# Patient Record
Sex: Male | Born: 1935 | Race: White | Hispanic: No | Marital: Married | Smoking: Former smoker
Health system: Southern US, Community
[De-identification: ages and names within clinical notes are randomized; demographics above are authoritative.]

## PROBLEM LIST (undated history)

## (undated) DIAGNOSIS — I10 Essential (primary) hypertension: Secondary | ICD-10-CM

## (undated) DIAGNOSIS — E119 Type 2 diabetes mellitus without complications: Secondary | ICD-10-CM

## (undated) DIAGNOSIS — E785 Hyperlipidemia, unspecified: Secondary | ICD-10-CM

## (undated) DIAGNOSIS — I219 Acute myocardial infarction, unspecified: Secondary | ICD-10-CM

## (undated) HISTORY — PX: OTHER SURGICAL HISTORY: SHX169

---

## 2002-08-29 ENCOUNTER — Emergency Department (HOSPITAL_COMMUNITY): Admission: EM | Admit: 2002-08-29 | Discharge: 2002-08-29 | Payer: Self-pay | Admitting: Emergency Medicine

## 2003-11-10 ENCOUNTER — Ambulatory Visit (HOSPITAL_COMMUNITY): Admission: RE | Admit: 2003-11-10 | Discharge: 2003-11-10 | Payer: Self-pay | Admitting: Internal Medicine

## 2004-11-14 ENCOUNTER — Emergency Department (HOSPITAL_COMMUNITY): Admission: EM | Admit: 2004-11-14 | Discharge: 2004-11-14 | Payer: Self-pay | Admitting: Emergency Medicine

## 2008-12-03 ENCOUNTER — Emergency Department (HOSPITAL_COMMUNITY): Admission: EM | Admit: 2008-12-03 | Discharge: 2008-12-03 | Payer: Self-pay | Admitting: Emergency Medicine

## 2009-04-07 ENCOUNTER — Ambulatory Visit (HOSPITAL_COMMUNITY): Admission: RE | Admit: 2009-04-07 | Discharge: 2009-04-08 | Payer: Self-pay | Admitting: Ophthalmology

## 2010-01-10 ENCOUNTER — Emergency Department (HOSPITAL_COMMUNITY): Admission: EM | Admit: 2010-01-10 | Discharge: 2010-01-10 | Payer: Self-pay | Admitting: Emergency Medicine

## 2010-01-13 IMAGING — CR DG NECK SOFT TISSUE
1 series · 1 of 1 positions shown · non-contrast
Comparison: None

CLINICAL DATA: Difficulty swallowing.

NECK SOFT TISSUES - 1+ VIEW

[view not recorded]
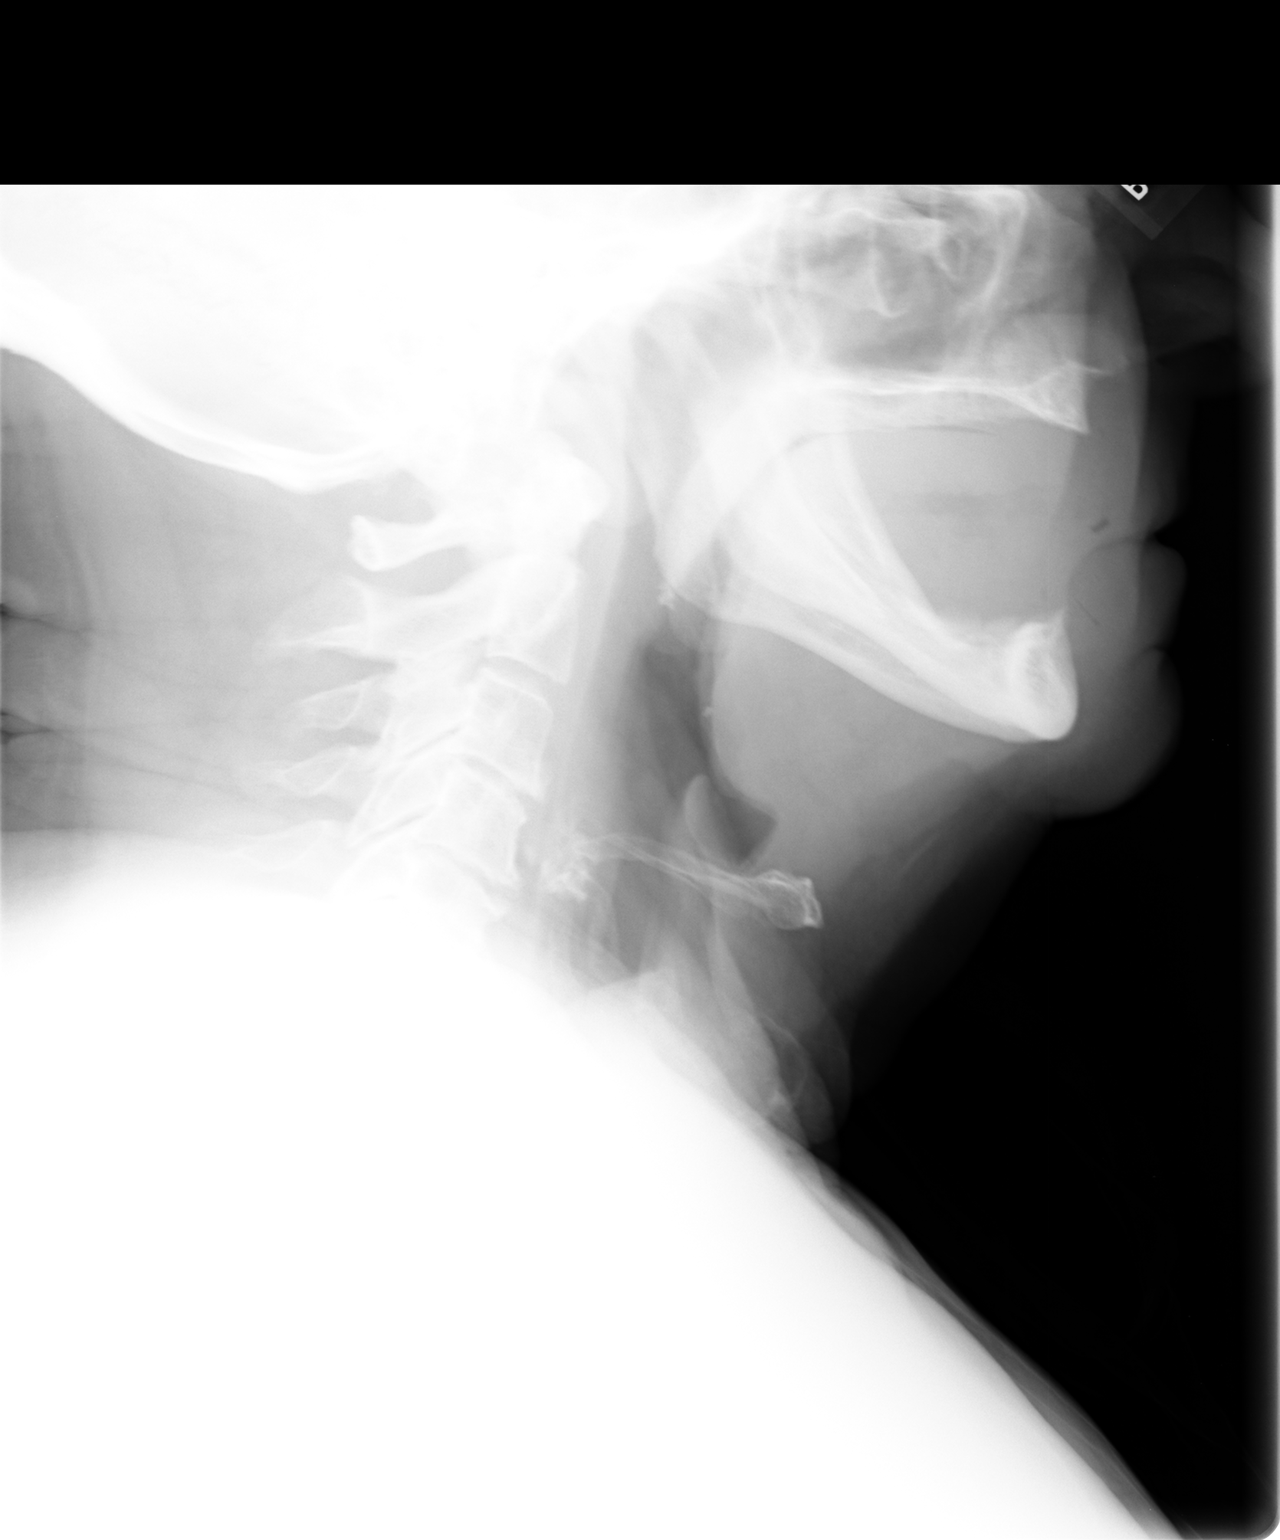

[1 of 1 positions shown; findings below may reference images not displayed]

FINDINGS: The patient is slightly rotated.  Epiglottic and
aryepiglottic fold shadows are sharp.  Prevertebral soft tissues
are within normal limits.  Degenerative changes are seen in the
spine.
IMPRESSION: 1.  No acute findings in the airway.
2.  Cervical spondylosis.

## 2011-02-20 IMAGING — CR DG CHEST 2V
2 series · 2 of 2 positions shown · non-contrast
Comparison: 12/03/2008

CLINICAL DATA: Cough, congestion, flu symptoms

CHEST - 2 VIEW

[view not recorded (1 of 2)]
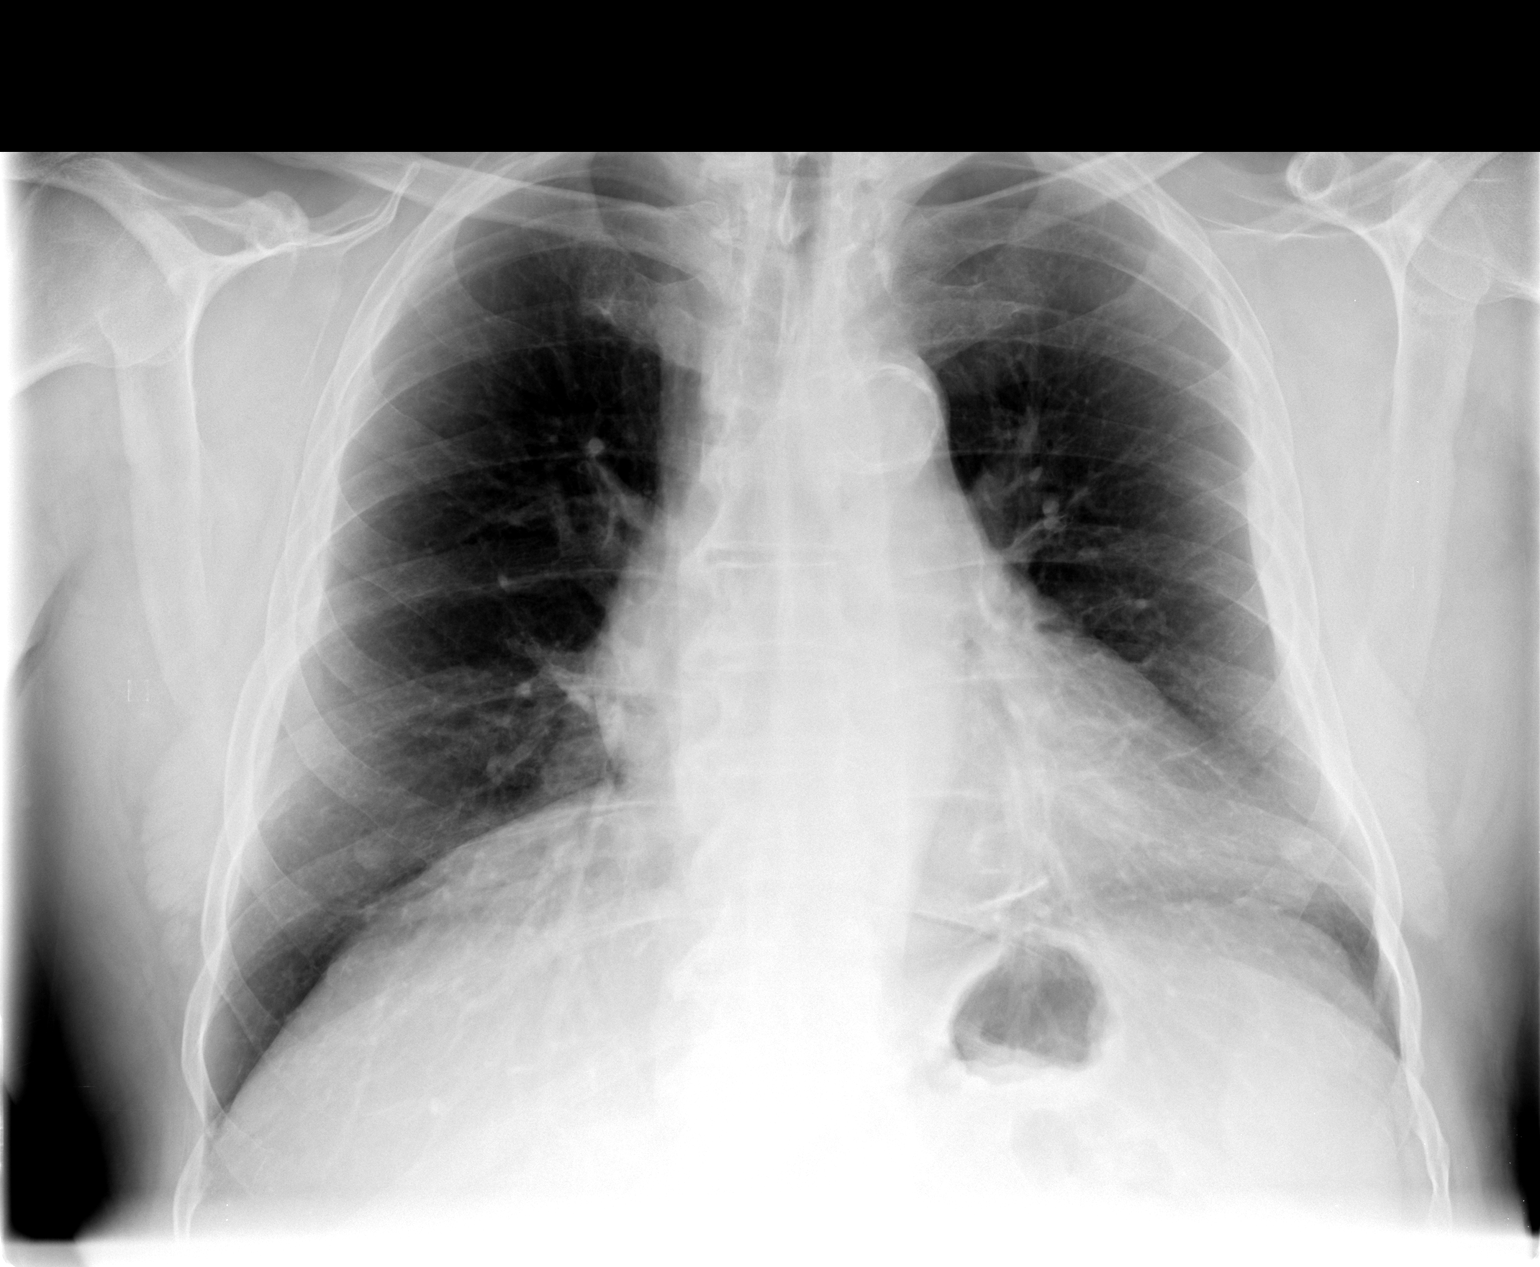

[view not recorded (2 of 2)]
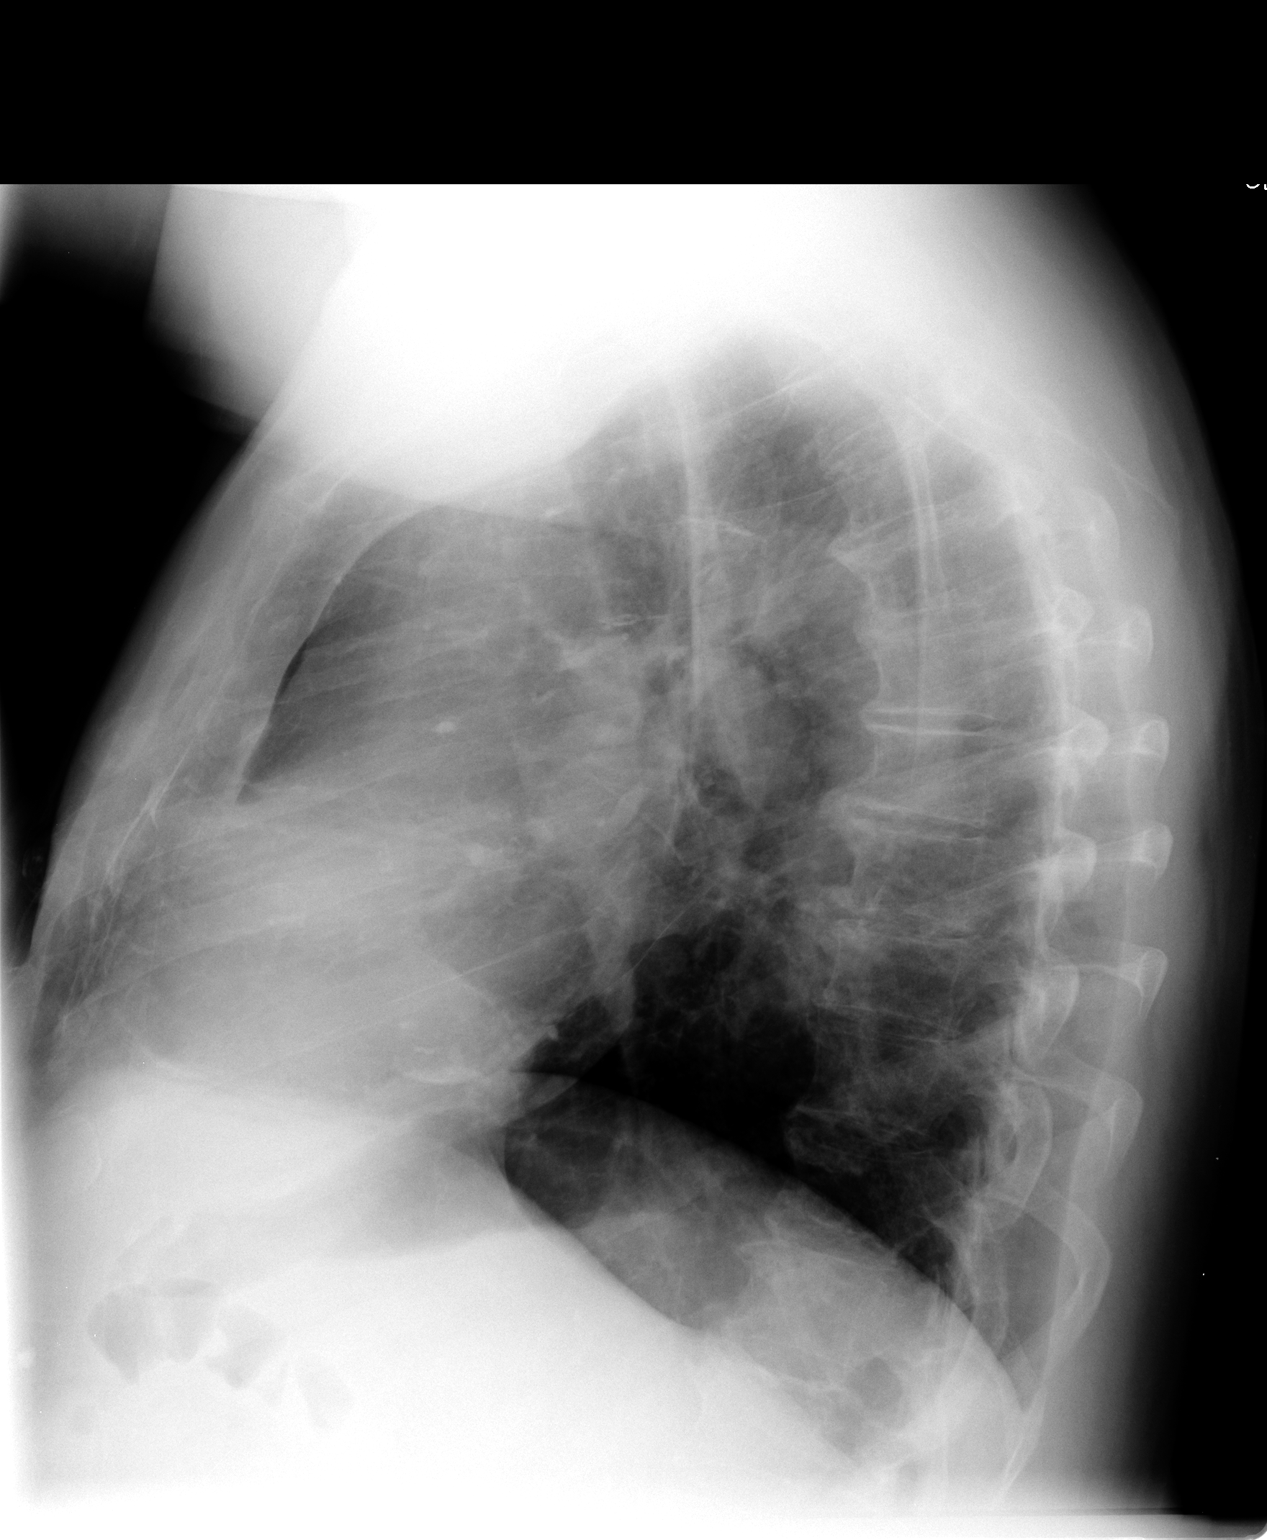

[2 of 2 positions shown; findings below may reference images not displayed]

FINDINGS: Stable mild cardiac enlargement.  Negative for CHF,
effusion or pneumothorax.  Minimal basilar atelectasis versus
scarring.  Degenerative thoracic spine.  Nipple shadows noted
bilaterally.  Atherosclerosis of the aorta.
IMPRESSION: Stable chronic changes.  No acute process.

## 2011-03-30 LAB — CBC
HCT: 42.7 % (ref 39.0–52.0)
Hemoglobin: 14.9 g/dL (ref 13.0–17.0)
MCV: 84.1 fL (ref 78.0–100.0)
RBC: 5.08 MIL/uL (ref 4.22–5.81)
WBC: 10.1 10*3/uL (ref 4.0–10.5)

## 2011-03-30 LAB — BASIC METABOLIC PANEL
CO2: 26 mEq/L (ref 19–32)
GFR calc non Af Amer: 56 mL/min — ABNORMAL LOW (ref >60)
Glucose, Bld: 212 mg/dL — ABNORMAL HIGH (ref 70–99)
Potassium: 3.9 mEq/L (ref 3.5–5.1)
Sodium: 140 mEq/L (ref 135–145)

## 2011-05-03 NOTE — Op Note (Signed)
NAMESONG, GARRIS                 ACCOUNT NO.:  [id number]   MEDICAL RECORD NO.:  [id number]          PATIENT TYPE:  OIB   LOCATION:  5151                         FACILITY:  [facility]   PHYSICIAN:  John D. Ashley Royalty, M.D. 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   PROCEDURES:  1. Pars plana vitrectomy, left eye.  2. Retinal photocoagulation, left eye.  3. Placement of secondary intraocular lens with suture and gas-fluid      exchange, left eye.   SURGEON:  Beulah Gandy. Ashley Royalty, MD   ASSISTANT:  Rosalie Doctor, MA   ANESTHESIA:  General.   DETAILS OF THE PROCEDURE:  Usual prep and drape, conjunctival peritomy  from 8 o'clock to 4 o'clock.  Two half-thickness scleral flaps were  raised at 3 o'clock and 9 o'clock in anticipation of IOL suture.  A 3  layer corneoscleral wound was opened up between 11 o'clock and 2  o'clock.  The 25-gauge trocars were placed at 10 o'clock, 2 o'clock, and  4 o'clock with infusion at 4 o'clock.  Pars plana vitrectomy was begun  just behind the pupillary axis.  Provisc was placed on the corneal  surface and the BIOM viewing system was used.  There was areas of dense  corneal edema.  The vitrectomy was carried posteriorly down to the  macular surface where blood and vitreous was encountered.  This was  carefully removed.  Then the vitrectomy was carried out into the far  periphery down to the vitreous base where it was trimmed for 360  degrees.  All blood was removed.  The endolaser was positioned in the  eye, power was 2000 milliwatts, 1000 microns each, 624 burns, 0.1  seconds each.  These burns were placed in 2 rows in the retinal  periphery.  The corneal wound was opened.  Two Prolene sutures were  placed beneath the scleral flaps from 3 o'clock to 9 o'clock behind the  iris and  anterior to capsular remnants in the ciliary sulcus.  These  sutures were drawn externally through the corneoscleral wound.  An  intraocular lens was brought onto the field made by [employer] model CC70BD, power 22.5 D, length 12.5 mm, optic 7.0 mm, serial  #[id number], expiration date April 2013.  The lens was brought onto  the field, cleaned, and inspected.  The Prolene sutures were attached to  the eyelets of the lens.  The lens was passed through the corneoscleral  wound into the anterior chamber then into the posterior chamber.  It was  dialed into place in the ciliary sulcus.  The Prolene sutures were  knotted beneath the scleral flaps and the scleral flaps were allowed to  cover the knots.  The corneal wound was closed with 4 interrupted 10-0  nylon sutures.  Pars plana vitrectomy was once again performed, removing  granules of pigment.  A 30% gas-fluid exchange was performed.  The  instruments were removed from the eye and the 25-gauge trocars were  removed.  The conjunctiva was reposited with 7-0 chromic suture.  Polymyxin and gentamicin were irrigated into [place] space.  Marcaine was  injected around the globe for postop pain.  Decadron 10 mg was injected  into the lower subconjunctival space.  Polysporin ophthalmic ointment, a  patch, and shield were placed.  Closing pressure was 10 with a [place]  tonometer.   COMPLICATIONS:  None.   DURATION:  1 hour 15 minutes.      Beulah Gandy. Ashley Royalty, M.D.  Electronically Signed     JDM/MEDQ  D:  04/07/2009  T:  04/08/2009  Job:  [id number]

## 2011-05-06 NOTE — H&P (Signed)
NAME:  Andrew Madden, Andrew Madden                             ACCOUNT NO.:  [id number]   MEDICAL RECORD NO.:  [id number]                  PATIENT TYPE:   LOCATION:                                       FACILITY:   PHYSICIAN:  Lionel December, M.D.                 DATE OF BIRTH:  1929/10/02   DATE OF ADMISSION:  DATE OF DISCHARGE:                                HISTORY & PHYSICAL   CHIEF COMPLAINT:  Chronic diarrhea.   HISTORY OF PRESENT ILLNESS:  [name] is a 77 year old Caucasian male  who presents to our office with a two-month history of persistent, loose,  watery diarrhea with approximately seven to eight stools per day.  He states  that the stools are nonbloody and nonmucous.  He also claims they are a dark  yellowish brown.  He denies any associated abdominal pain or cramping.  He  does report decreased appetite secondary to fear of going to the restroom.  He reports a 23-pound weight loss in the past two months.  He denies any  recent travel, new pets, or recent antibiotics.  He denies any exposure to  recent gastroenteritis.  He has never had colonoscopy before.  He denies any  history of IBS or peptic ulcer disease.  He denies any family history of  colorectal carcinoma, liver, or chronic GI problems.   PAST MEDICAL HISTORY:  1. Hypertension.  2. Hypercholesterolemia.  3. CAD and MI in 1994.   PAST SURGICAL HISTORY:  Denies any.   CURRENT MEDICATIONS:  1. Univasc 15 mg one-half tablet daily.  2. Hydrochlorothiazide 12.5 mg daily.  3. Lipitor 20 mg daily.  4. Metoprolol 25 mg daily.  5. Closiramine - discontinued on October 21, 2003.  6. Aspirin 325 mg daily.   ALLERGIES:  No known drug allergies.   FAMILY HISTORY:  No known family history of colorectal carcinoma, liver, or  chronic GI problems.  Mother is deceased at age 4 due to diabetes mellitus  and CAD.  Father is deceased at 15 secondary to dementia.  He has four  sisters as well as four brothers alive with  multiple health problems, and  two brothers deceased secondary to carcinoma of unknown etiology.   SOCIAL HISTORY:  Andrew Madden has been married for 43 years.  He has one grown  son who is in good health.  He is a retired [employer] educational needs teacher.  He  denies any tobacco or alcohol or drug use.   REVIEW OF SYSTEMS:  CONSTITUTIONAL:  See HPI.  Significant weight loss.  Denies any fever or chills.  SKIN:  He denies any rash or jaundice.  GI:  See HPI.  He denies any dyspepsia, GERD, or melena.  ENDOCRINE:  He denies  any thyroid problems or diabetes mellitus.   PHYSICAL EXAMINATION:  VITAL SIGNS:  Weight 187 pounds, height 67 inches.  Temperature 98.2, blood pressure 140/96,  pulse 80.  GENERAL:  Andrew Madden is a 77 year old Caucasian male who presents to  our office and appears his stated age.  He is nontoxic, well-developed, well-  nourished, and in no acute distress.  HEENT:  Sclerae clear, conjunctivae pink.  Oropharynx pink and moist without  any lesions.  NECK:  Supple without mass or thyromegaly.  CHEST:  Heart regular rate and rhythm without murmurs, clicks, rubs, or  gallops.  Lungs clear to auscultation bilaterally.  ABDOMEN:  Positive bowel sounds x4.  No bruits auscultated.  Soft,  nontender, nondistended.  No palpable organomegaly.  EXTREMITIES:  Show 2+ pedal pulses bilaterally, no pedal edema.  SKIN:  Pink, warm, and dry without any rash or jaundice.  RECTAL:  There are multiple small hemorrhoids to the exterior sphincter.  They are nonthrombosed or erythematous.  Internal exam reveals small amount  of thin, yellowish-brown heme negative stool.   ASSESSMENT:  [name] is a 77 year old Caucasian male with persistent  chronic diarrhea.  Would like to obtain stool studies today to rule out any  infectious process.  I will also check thyroid as well as MET-7 to rule out  hyperthyroidism which is unlikely, or any metabolic disturbances.  Recommended colonoscopy as  he has not yet has screening colonoscopy, as well  as possibly obtaining biopsy for evaluation of chronic diarrhea.  This will  be performed by Dr. Karilyn Cota at [facility].  I have discussed  colonoscopy with Andrew Madden as well as his wife.  I discussed risks and  benefits which include but are not limited to bleeding, perforation, and  infection.  He agrees with this plan.  He was instructed to hold his aspirin  for three days prior to the procedure.   RECOMMENDATIONS:  1. Colonoscopy with Dr. Karilyn Cota at [facility].  2. Encouraged increased p.o. fluids and diet as tolerated.  3.Can take Imodium as needed.  1. Stool studies for C&S, O&P, C. difficile, and wbc's.  2. Laboratory studies to include TSH, CBC, and MET-7.  3. Further recommendations to follow pending colonoscopy by Dr. Karilyn Cota.   We would like to thank Dr. Juanetta Gosling for this kind referral.     _____________________________________  ___________________________________________  Nicholas Lose, N.P.               Lionel December, M.D.   KC/MEDQ  D:  10/22/2003  T:  10/22/2003  Job:  [id number]   cc:   Ramon Dredge L. Juanetta Gosling, M.D.  [address]  [place]  [place] [address]  Fax: [phone]

## 2011-05-06 NOTE — Op Note (Signed)
NAME:  Andrew Madden, Andrew Madden                           ACCOUNT NO.:  [id number]   MEDICAL RECORD NO.:  [id number]                   PATIENT TYPE:  AMB   LOCATION:  DAY                                  FACILITY:  [facility]   PHYSICIAN:  Lionel December, M.D.                 DATE OF BIRTH:  1929/05/26   DATE OF PROCEDURE:  11/10/2003  DATE OF DISCHARGE:                                 OPERATIVE REPORT   PROCEDURE:  Total colonoscopy.   ENDOSCOPIST:  Lionel December, M.D.   INDICATIONS:  [name] is a 77 year old Caucasian male who has had a more  than 55-month history of nonbloody diarrhea.  His stool study has been  negative and he has lost 23 pounds.  He is undergoing diagnostic  colonoscopy.  The procedure and risks were reviewed with the patient and  informed consent was obtained.   PREOPERATIVE MEDICATIONS:  Demerol 25 mg IV and Versed 4 mg IV in divided  dose.   FINDINGS:  Procedure performed in endoscopy suite.  The patient's vital  signs and O2 saturation were monitored during the procedure and remained  stable.  The patient was placed in the left lateral recumbent position and  rectal examination was performed.  He has soft central skin tags.  Digital  exam was normal.   Olympus videoscope was placed in the rectum and advanced under vision into  the sigmoid colon and beyond.  Preparation was excellent.  He had a few  small diverticula at the sigmoid colon.  There were patchy erythema erosions  involving most of the transverse colon as well as ascending colon.  Cecal  mucosa revealed some friability.  The cecal landmarks were identified and  photographed for the record.  Ileocecal valve was normal.  As the scope was  withdrawn the colonic mucosa was, once again, carefully examined.  A biopsy  was taken from this inflamed mucosa at the transverse colon.  There were 2  polyps that were snared.  The larger one was at the [facility] colon 7-8 mm.  Another one was at the rectosigmoid junction  at about 4 mm.  Both of these  were retrieved for histologic examination.  The rectal mucosa was normal.   The scope was retroflexed to examine the anorectal junction and moderate-  sized hemorrhoids were noted below the dentate line.  The endoscope was  straightened and withdrawn.  The patient tolerated the procedure well.   FINAL DIAGNOSES:  1. Patchy colitis involving the proximal half of the colon.  Endoscopic     appearance suspicious for resolving antibiotic induced colitis.  2. A few diverticula at the sigmoid colon.  3. Two polyps snared, one from the sigmoid colon and another one from the     rectosigmoid junction.  4. Moderate sized external hemorrhoids.   RECOMMENDATIONS:  1. Standard instructions given.  2. Metronidazole 250 mg p.o. t.i.d. for 1  week.  3. I will be contacting the patient with the biopsy results and further     recommendations.      ___________________________________________                                            Lionel December, M.D.   NR/MEDQ  D:  11/10/2003  T:  11/10/2003  Job:  [id number]   cc:   Ramon Dredge L. Juanetta Gosling, M.D.  [address]  [place]  [place] [address]  Fax: [phone]

## 2013-02-14 ENCOUNTER — Other Ambulatory Visit (HOSPITAL_COMMUNITY): Payer: Self-pay | Admitting: Pulmonary Disease

## 2013-02-14 DIAGNOSIS — R221 Localized swelling, mass and lump, neck: Secondary | ICD-10-CM

## 2013-02-19 ENCOUNTER — Ambulatory Visit (HOSPITAL_COMMUNITY): Payer: Medicare Other

## 2015-02-26 DIAGNOSIS — H9193 Unspecified hearing loss, bilateral: Secondary | ICD-10-CM | POA: Diagnosis not present

## 2015-02-26 DIAGNOSIS — I2510 Atherosclerotic heart disease of native coronary artery without angina pectoris: Secondary | ICD-10-CM | POA: Diagnosis not present

## 2015-02-26 DIAGNOSIS — I10 Essential (primary) hypertension: Secondary | ICD-10-CM | POA: Diagnosis not present

## 2015-02-26 DIAGNOSIS — E119 Type 2 diabetes mellitus without complications: Secondary | ICD-10-CM | POA: Diagnosis not present

## 2015-08-31 DIAGNOSIS — I2510 Atherosclerotic heart disease of native coronary artery without angina pectoris: Secondary | ICD-10-CM | POA: Diagnosis not present

## 2015-08-31 DIAGNOSIS — E119 Type 2 diabetes mellitus without complications: Secondary | ICD-10-CM | POA: Diagnosis not present

## 2015-08-31 DIAGNOSIS — I10 Essential (primary) hypertension: Secondary | ICD-10-CM | POA: Diagnosis not present

## 2015-09-15 DIAGNOSIS — I10 Essential (primary) hypertension: Secondary | ICD-10-CM | POA: Diagnosis not present

## 2015-09-15 DIAGNOSIS — I2510 Atherosclerotic heart disease of native coronary artery without angina pectoris: Secondary | ICD-10-CM | POA: Diagnosis not present

## 2015-09-15 DIAGNOSIS — E119 Type 2 diabetes mellitus without complications: Secondary | ICD-10-CM | POA: Diagnosis not present

## 2016-02-29 DIAGNOSIS — E119 Type 2 diabetes mellitus without complications: Secondary | ICD-10-CM | POA: Diagnosis not present

## 2016-02-29 DIAGNOSIS — I10 Essential (primary) hypertension: Secondary | ICD-10-CM | POA: Diagnosis not present

## 2016-02-29 DIAGNOSIS — I2510 Atherosclerotic heart disease of native coronary artery without angina pectoris: Secondary | ICD-10-CM | POA: Diagnosis not present

## 2016-02-29 DIAGNOSIS — E785 Hyperlipidemia, unspecified: Secondary | ICD-10-CM | POA: Diagnosis not present

## 2016-02-29 DIAGNOSIS — Z23 Encounter for immunization: Secondary | ICD-10-CM | POA: Diagnosis not present

## 2016-08-31 DIAGNOSIS — I129 Hypertensive chronic kidney disease with stage 1 through stage 4 chronic kidney disease, or unspecified chronic kidney disease: Secondary | ICD-10-CM | POA: Diagnosis not present

## 2016-08-31 DIAGNOSIS — E119 Type 2 diabetes mellitus without complications: Secondary | ICD-10-CM | POA: Diagnosis not present

## 2016-08-31 DIAGNOSIS — I2510 Atherosclerotic heart disease of native coronary artery without angina pectoris: Secondary | ICD-10-CM | POA: Diagnosis not present

## 2016-08-31 DIAGNOSIS — D696 Thrombocytopenia, unspecified: Secondary | ICD-10-CM | POA: Diagnosis not present

## 2016-08-31 DIAGNOSIS — Z23 Encounter for immunization: Secondary | ICD-10-CM | POA: Diagnosis not present

## 2017-04-04 DIAGNOSIS — I2510 Atherosclerotic heart disease of native coronary artery without angina pectoris: Secondary | ICD-10-CM | POA: Diagnosis not present

## 2017-04-04 DIAGNOSIS — E119 Type 2 diabetes mellitus without complications: Secondary | ICD-10-CM | POA: Diagnosis not present

## 2017-04-04 DIAGNOSIS — I10 Essential (primary) hypertension: Secondary | ICD-10-CM | POA: Diagnosis not present

## 2017-04-04 DIAGNOSIS — K644 Residual hemorrhoidal skin tags: Secondary | ICD-10-CM | POA: Diagnosis not present

## 2017-05-07 DIAGNOSIS — M5489 Other dorsalgia: Secondary | ICD-10-CM | POA: Diagnosis not present

## 2017-05-07 DIAGNOSIS — K640 First degree hemorrhoids: Secondary | ICD-10-CM | POA: Diagnosis not present

## 2017-05-07 DIAGNOSIS — E119 Type 2 diabetes mellitus without complications: Secondary | ICD-10-CM | POA: Diagnosis not present

## 2017-05-07 DIAGNOSIS — R197 Diarrhea, unspecified: Secondary | ICD-10-CM | POA: Diagnosis not present

## 2017-05-07 DIAGNOSIS — R531 Weakness: Secondary | ICD-10-CM | POA: Diagnosis not present

## 2017-05-07 DIAGNOSIS — R9431 Abnormal electrocardiogram [ECG] [EKG]: Secondary | ICD-10-CM | POA: Diagnosis not present

## 2017-05-23 DIAGNOSIS — E119 Type 2 diabetes mellitus without complications: Secondary | ICD-10-CM | POA: Diagnosis not present

## 2017-05-23 DIAGNOSIS — K649 Unspecified hemorrhoids: Secondary | ICD-10-CM | POA: Diagnosis not present

## 2017-05-23 DIAGNOSIS — Z79899 Other long term (current) drug therapy: Secondary | ICD-10-CM | POA: Diagnosis not present

## 2017-05-23 DIAGNOSIS — Z7984 Long term (current) use of oral hypoglycemic drugs: Secondary | ICD-10-CM | POA: Diagnosis not present

## 2017-05-23 DIAGNOSIS — Z7982 Long term (current) use of aspirin: Secondary | ICD-10-CM | POA: Diagnosis not present

## 2017-06-04 ENCOUNTER — Inpatient Hospital Stay (HOSPITAL_COMMUNITY)
Admission: EM | Admit: 2017-06-04 | Discharge: 2017-06-09 | DRG: 330 | Disposition: A | Discharge status: Home Health | Payer: Medicare HMO | Attending: Pulmonary Disease | Admitting: Pulmonary Disease

## 2017-06-04 ENCOUNTER — Emergency Department (HOSPITAL_COMMUNITY): Payer: Medicare HMO

## 2017-06-04 ENCOUNTER — Encounter (HOSPITAL_COMMUNITY): Payer: Self-pay | Admitting: Adult Health

## 2017-06-04 DIAGNOSIS — C20 Malignant neoplasm of rectum: Principal | ICD-10-CM | POA: Diagnosis present

## 2017-06-04 DIAGNOSIS — Z87891 Personal history of nicotine dependence: Secondary | ICD-10-CM

## 2017-06-04 DIAGNOSIS — I252 Old myocardial infarction: Secondary | ICD-10-CM

## 2017-06-04 DIAGNOSIS — Z23 Encounter for immunization: Secondary | ICD-10-CM

## 2017-06-04 DIAGNOSIS — K5730 Diverticulosis of large intestine without perforation or abscess without bleeding: Secondary | ICD-10-CM | POA: Diagnosis not present

## 2017-06-04 DIAGNOSIS — C787 Secondary malignant neoplasm of liver and intrahepatic bile duct: Secondary | ICD-10-CM | POA: Diagnosis present

## 2017-06-04 DIAGNOSIS — Z66 Do not resuscitate: Secondary | ICD-10-CM | POA: Diagnosis present

## 2017-06-04 DIAGNOSIS — E785 Hyperlipidemia, unspecified: Secondary | ICD-10-CM | POA: Diagnosis present

## 2017-06-04 DIAGNOSIS — Z515 Encounter for palliative care: Secondary | ICD-10-CM

## 2017-06-04 DIAGNOSIS — Z7189 Other specified counseling: Secondary | ICD-10-CM

## 2017-06-04 DIAGNOSIS — N179 Acute kidney failure, unspecified: Secondary | ICD-10-CM | POA: Diagnosis present

## 2017-06-04 DIAGNOSIS — Z7982 Long term (current) use of aspirin: Secondary | ICD-10-CM

## 2017-06-04 DIAGNOSIS — Z6824 Body mass index (BMI) 24.0-24.9, adult: Secondary | ICD-10-CM

## 2017-06-04 DIAGNOSIS — E119 Type 2 diabetes mellitus without complications: Secondary | ICD-10-CM | POA: Diagnosis present

## 2017-06-04 DIAGNOSIS — E440 Moderate protein-calorie malnutrition: Secondary | ICD-10-CM | POA: Diagnosis present

## 2017-06-04 DIAGNOSIS — R933 Abnormal findings on diagnostic imaging of other parts of digestive tract: Secondary | ICD-10-CM | POA: Diagnosis not present

## 2017-06-04 DIAGNOSIS — K654 Sclerosing mesenteritis: Secondary | ICD-10-CM | POA: Diagnosis not present

## 2017-06-04 DIAGNOSIS — K6389 Other specified diseases of intestine: Secondary | ICD-10-CM | POA: Diagnosis not present

## 2017-06-04 DIAGNOSIS — I10 Essential (primary) hypertension: Secondary | ICD-10-CM | POA: Diagnosis present

## 2017-06-04 DIAGNOSIS — K6289 Other specified diseases of anus and rectum: Secondary | ICD-10-CM | POA: Diagnosis present

## 2017-06-04 DIAGNOSIS — R1900 Intra-abdominal and pelvic swelling, mass and lump, unspecified site: Secondary | ICD-10-CM | POA: Diagnosis not present

## 2017-06-04 DIAGNOSIS — N2889 Other specified disorders of kidney and ureter: Secondary | ICD-10-CM | POA: Diagnosis not present

## 2017-06-04 DIAGNOSIS — K624 Stenosis of anus and rectum: Secondary | ICD-10-CM | POA: Diagnosis present

## 2017-06-04 DIAGNOSIS — K629 Disease of anus and rectum, unspecified: Secondary | ICD-10-CM | POA: Diagnosis not present

## 2017-06-04 HISTORY — DX: Hyperlipidemia, unspecified: E78.5

## 2017-06-04 HISTORY — DX: Essential (primary) hypertension: I10

## 2017-06-04 HISTORY — DX: Type 2 diabetes mellitus without complications: E11.9

## 2017-06-04 HISTORY — DX: Acute myocardial infarction, unspecified: I21.9

## 2017-06-04 LAB — CBC WITH DIFFERENTIAL/PLATELET
Basophils Absolute: 0 10*3/uL (ref 0.0–0.1)
Basophils Relative: 0 %
Eosinophils Absolute: 0.1 10*3/uL (ref 0.0–0.7)
Eosinophils Relative: 1 %
HCT: 38.1 % — ABNORMAL LOW (ref 39.0–52.0)
Hemoglobin: 12.8 g/dL — ABNORMAL LOW (ref 13.0–17.0)
Lymphocytes Relative: 22 %
Lymphs Abs: 2.5 10*3/uL (ref 0.7–4.0)
MCH: 29.3 pg (ref 26.0–34.0)
MCHC: 33.6 g/dL (ref 30.0–36.0)
MCV: 87.2 fL (ref 78.0–100.0)
Monocytes Absolute: 0.8 10*3/uL (ref 0.1–1.0)
Monocytes Relative: 7 %
Neutro Abs: 7.7 10*3/uL (ref 1.7–7.7)
Neutrophils Relative %: 70 %
Platelets: 203 10*3/uL (ref 150–400)
RBC: 4.37 MIL/uL (ref 4.22–5.81)
RDW: 14.1 % (ref 11.5–15.5)
WBC: 11.1 10*3/uL — ABNORMAL HIGH (ref 4.0–10.5)

## 2017-06-04 LAB — BASIC METABOLIC PANEL
Anion gap: 14 (ref 5–15)
BUN: 29 mg/dL — ABNORMAL HIGH (ref 6–20)
CO2: 28 mmol/L (ref 22–32)
Calcium: 9.8 mg/dL (ref 8.9–10.3)
Chloride: 103 mmol/L (ref 101–111)
Creatinine, Ser: 1.59 mg/dL — ABNORMAL HIGH (ref 0.61–1.24)
GFR calc Af Amer: 44 mL/min — ABNORMAL LOW (ref >60)
GFR calc non Af Amer: 38 mL/min — ABNORMAL LOW (ref >60)
Glucose, Bld: 143 mg/dL — ABNORMAL HIGH (ref 65–99)
Potassium: 3.9 mmol/L (ref 3.5–5.1)
Sodium: 145 mmol/L (ref 135–145)

## 2017-06-04 LAB — CBG MONITORING, ED: Glucose-Capillary: 132 mg/dL — ABNORMAL HIGH (ref 65–99)

## 2017-06-04 LAB — URINALYSIS, ROUTINE W REFLEX MICROSCOPIC
Bilirubin Urine: NEGATIVE
Glucose, UA: NEGATIVE mg/dL
Hgb urine dipstick: NEGATIVE
Ketones, ur: 5 mg/dL — AB
Leukocytes, UA: NEGATIVE
Nitrite: NEGATIVE
Protein, ur: NEGATIVE mg/dL
Specific Gravity, Urine: 1.038 — ABNORMAL HIGH (ref 1.005–1.030)
pH: 6 (ref 5.0–8.0)

## 2017-06-04 LAB — GLUCOSE, CAPILLARY: Glucose-Capillary: 148 mg/dL — ABNORMAL HIGH (ref 65–99)

## 2017-06-04 MED ORDER — LIDOCAINE HCL 2 % EX GEL
1.0000 "application " | Freq: Once | CUTANEOUS | Status: AC
  Administered 2017-06-04: 1 via TOPICAL
  Filled 2017-06-04: qty 10

## 2017-06-04 MED ORDER — SIMVASTATIN 20 MG PO TABS
20.0000 mg | ORAL_TABLET | Freq: Every day | ORAL | Status: DC
  Administered 2017-06-05 – 2017-06-08 (×4): 20 mg via ORAL
  Filled 2017-06-04 (×4): qty 1

## 2017-06-04 MED ORDER — NEOMY-BACIT-POLYMYX-PRAMOXINE 1 % EX OINT: 1.0000 "application " | TOPICAL_OINTMENT | Freq: Two times a day (BID) | CUTANEOUS | Status: DC

## 2017-06-04 MED ORDER — ACETAMINOPHEN 325 MG PO TABS: 650.0000 mg | ORAL_TABLET | Freq: Four times a day (QID) | ORAL | Status: DC | PRN

## 2017-06-04 MED ORDER — PNEUMOCOCCAL VAC POLYVALENT 25 MCG/0.5ML IJ INJ
0.5000 mL | INJECTION | INTRAMUSCULAR | Status: AC
  Administered 2017-06-05: 0.5 mL via INTRAMUSCULAR
  Filled 2017-06-04: qty 0.5

## 2017-06-04 MED ORDER — ENSURE ENLIVE PO LIQD
237.0000 mL | Freq: Two times a day (BID) | ORAL | Status: DC
  Administered 2017-06-05: 237 mL via ORAL

## 2017-06-04 MED ORDER — IOPAMIDOL (ISOVUE-300) INJECTION 61%
75.0000 mL | Freq: Once | INTRAVENOUS | Status: AC | PRN
  Administered 2017-06-04: 75 mL via INTRAVENOUS

## 2017-06-04 MED ORDER — HYDROCORTISONE 2.5 % RE CREA
1.0000 "application " | TOPICAL_CREAM | Freq: Two times a day (BID) | RECTAL | Status: DC
  Administered 2017-06-05: 1 via TOPICAL
  Filled 2017-06-04 (×2): qty 28.35

## 2017-06-04 MED ORDER — HYDROCODONE-ACETAMINOPHEN 5-325 MG PO TABS
1.0000 | ORAL_TABLET | ORAL | Status: DC | PRN
  Administered 2017-06-05 – 2017-06-07 (×4): 2 via ORAL
  Administered 2017-06-08 (×2): 1 via ORAL
  Filled 2017-06-04: qty 2
  Filled 2017-06-04 (×2): qty 1
  Filled 2017-06-04 (×3): qty 2

## 2017-06-04 MED ORDER — INSULIN ASPART 100 UNIT/ML ~~LOC~~ SOLN
0.0000 [IU] | Freq: Three times a day (TID) | SUBCUTANEOUS | Status: DC
  Administered 2017-06-07: 1 [IU] via SUBCUTANEOUS
  Administered 2017-06-07: 2 [IU] via SUBCUTANEOUS
  Administered 2017-06-08: 1 [IU] via SUBCUTANEOUS

## 2017-06-04 MED ORDER — ONDANSETRON HCL 4 MG PO TABS: 4.0000 mg | ORAL_TABLET | Freq: Four times a day (QID) | ORAL | Status: DC | PRN

## 2017-06-04 MED ORDER — SODIUM CHLORIDE 0.9 % IV BOLUS (SEPSIS)
1000.0000 mL | Freq: Once | INTRAVENOUS | Status: AC
  Administered 2017-06-04: 1000 mL via INTRAVENOUS

## 2017-06-04 MED ORDER — METOPROLOL TARTRATE 25 MG PO TABS
25.0000 mg | ORAL_TABLET | Freq: Two times a day (BID) | ORAL | Status: DC
  Administered 2017-06-05 – 2017-06-09 (×8): 25 mg via ORAL
  Filled 2017-06-04 (×8): qty 1

## 2017-06-04 MED ORDER — ONDANSETRON HCL 4 MG/2ML IJ SOLN
4.0000 mg | Freq: Once | INTRAMUSCULAR | Status: AC
  Administered 2017-06-04: 4 mg via INTRAVENOUS
  Filled 2017-06-04: qty 2

## 2017-06-04 MED ORDER — ONDANSETRON HCL 4 MG/2ML IJ SOLN: 4.0000 mg | Freq: Four times a day (QID) | INTRAMUSCULAR | Status: DC | PRN

## 2017-06-04 MED ORDER — LORAZEPAM 2 MG/ML IJ SOLN
1.0000 mg | Freq: Once | INTRAMUSCULAR | Status: AC
  Administered 2017-06-04: 1 mg via INTRAVENOUS
  Filled 2017-06-04: qty 1

## 2017-06-04 MED ORDER — SODIUM CHLORIDE 0.9 % IV SOLN
INTRAVENOUS | Status: AC
  Administered 2017-06-04: 23:00:00 via INTRAVENOUS

## 2017-06-04 NOTE — ED Notes (Signed)
Report given to Shady Dale, RN 300

## 2017-06-04 NOTE — ED Notes (Signed)
EKG given to Dr. Kohut 

## 2017-06-04 NOTE — H&P (Signed)
History and Physical    Andrew Madden MPN:[id number] DOB: 09-14-1929 DOA: 06/04/2017  PCP: Sinda Du, MD  Patient coming from:  home  Chief Complaint:   Rectal pain  HPI: Andrew Madden is a 83 y.o. male with medical history significant of DM, HTN, HLD comes in with over 2 months of progressive worsening rectal pain.  Pt was hospitalized at [facility] recently and was told to follow up with GI as outpatient however there was no imaging done.  Pt went home and did not follow up with GI and the pain has persisted.  Some passing of gas but very hard to have a bowel movement.  They have been thinking this is all due to his hemorrhoids.  There has been some blood when he wipes.  No vomiting but getting nauseas.  No fevers.  Not much of an appetite.  He has tried taking some of his wifes oxycodone pills which has made him very sick with vomiting.  Pt today found to have rectal mass with likely metastatic disease, no complete obstruction.  GI called and will do scope in the am.    Review of Systems: As per HPI otherwise 10 point review of systems negative.   Past Medical History:  Diagnosis Date  . Diabetes mellitus without complication ([place])   . Hyperlipidemia   . Hypertension   . Myocardial infarct [facility])     History reviewed. No pertinent surgical history.   reports that he has quit smoking. He has never used smokeless tobacco. He reports that he does not drink alcohol. His drug history is not on file.  No Known Allergies  History reviewed. No pertinent family history. no premature CAD  Prior to Admission medications   Medication Sig Start Date End Date Taking? Authorizing Provider  acetaminophen (TYLENOL) 325 MG tablet Take 650 mg by mouth every 6 (six) hours as needed for mild pain, fever or headache.   Yes [provider]  aspirin 325 MG tablet Take 650 mg by mouth daily.    Yes [provider]  hydrocortisone 2.5 % cream Apply 1 application topically 2 (two)  times daily. 05/23/17  Yes [provider]  losartan-hydrochlorothiazide (HYZAAR) 100-12.5 MG tablet Take 1 tablet by mouth daily. 03/17/17  Yes [provider]  metFORMIN (GLUMETZA) 500 MG (MOD) 24 hr tablet Take 500 mg by mouth daily with breakfast.   Yes [provider]  metoprolol tartrate (LOPRESSOR) 25 MG tablet Take 25 mg by mouth 2 (two) times daily.   Yes [provider]  PROCTOZONE-HC 2.5 % rectal cream Apply 1 application topically 2 (two) times daily. 05/03/17  Yes [provider]  simvastatin (ZOCOR) 40 MG tablet Take 20 mg by mouth at bedtime.    Yes [provider]  TRIPLE ANTIBIOTIC PLUS MAX ST 1 % OINT Apply 1 application topically 2 (two) times daily. 03/20/17  Yes [provider]    Physical Exam: Vitals:   06/04/17 1514 06/04/17 1516 06/04/17 1846  BP: 117/80  131/68  Pulse: 94  77  Resp: 18  18  Temp: 98.1 F (36.7 C)    TempSrc: Oral    SpO2: 98%  94%  Weight:  70.3 kg (155 lb)     Constitutional: NAD, calm, comfortable Vitals:   06/04/17 1514 06/04/17 1516 06/04/17 1846  BP: 117/80  131/68  Pulse: 94  77  Resp: 18  18  Temp: 98.1 F (36.7 C)    TempSrc: Oral  SpO2: 98%  94%  Weight:  70.3 kg (155 lb)    Eyes: PERRL, lids and conjunctivae normal ENMT: Mucous membranes are moist. Posterior pharynx clear of any exudate or lesions.Normal dentition.  Neck: normal, supple, no masses, no thyromegaly Respiratory: clear to auscultation bilaterally, no wheezing, no crackles. Normal respiratory effort. No accessory muscle use.  Cardiovascular: Regular rate and rhythm, no murmurs / rubs / gallops. No extremity edema. 2+ pedal pulses. No carotid bruits.  Abdomen: no tenderness, no masses palpated. No hepatosplenomegaly. Bowel sounds positive.  Musculoskeletal: no clubbing / cyanosis. No joint deformity upper and lower extremities. Good ROM, no contractures. Normal muscle tone.  Skin: no rashes, lesions,  ulcers. No induration Neurologic: CN 2-12 grossly intact. Sensation intact, DTR normal. Strength 5/5 in all 4.  Psychiatric: Normal judgment and insight. Alert and oriented x 3. Normal mood.    Labs on Admission: I have personally reviewed following labs and imaging studies  CBC:  Recent Labs Lab 06/04/17 1644  WBC 11.1*  NEUTROABS 7.7  HGB 12.8*  HCT 38.1*  MCV 87.2  PLT 578   Basic Metabolic Panel:  Recent Labs Lab 06/04/17 1644  NA 145  K 3.9  CL 103  CO2 28  GLUCOSE 143*  BUN 29*  CREATININE 1.59*  CALCIUM 9.8   GFR: CrCl cannot be calculated (Unknown ideal weight.).  CBG:  Recent Labs Lab 06/04/17 1555  GLUCAP 132*   Urine analysis:    Component Value Date/Time   COLORURINE YELLOW 06/04/2017 2020   APPEARANCEUR CLEAR 06/04/2017 2020   LABSPEC 1.038 (H) 06/04/2017 2020   PHURINE 6.0 06/04/2017 2020   GLUCOSEU NEGATIVE 06/04/2017 2020   HGBUR NEGATIVE 06/04/2017 2020   BILIRUBINUR NEGATIVE 06/04/2017 2020   KETONESUR 5 (A) 06/04/2017 2020   PROTEINUR NEGATIVE 06/04/2017 2020   NITRITE NEGATIVE 06/04/2017 2020   LEUKOCYTESUR NEGATIVE 06/04/2017 2020    Radiological Exams on Admission: Ct Abdomen Pelvis W Contrast  Result Date: 06/04/2017 CLINICAL DATA:  83 year old male with rectal pain and weight loss. EXAM: CT ABDOMEN AND PELVIS WITH CONTRAST TECHNIQUE: Multidetector CT imaging of the abdomen and pelvis was performed using the standard protocol following bolus administration of intravenous contrast. CONTRAST:  43mL ISOVUE-300 IOPAMIDOL (ISOVUE-300) INJECTION 61% COMPARISON:  None. FINDINGS: Lower chest: There is an 8 mm right middle lobe pulmonary nodule. A 5 mm nodule is also seen in the right middle lobe. Small pulmonary nodules including a 4 mm left lower lobe as well as a 5 mm right lower lobe noted. Nonemergent CT of the chest is recommended for complete evaluation of the lungs. There is coronary vascular calcification primarily involving the  LAD, and RCA. There is an area of calcification involving the inferior wall of the left ventricle which may be related to prior infarct. There is no intra-abdominal free air or free fluid. Hepatobiliary: There multiple hepatic hypodense lesions. The largest lesion or confluence of adjacent lesions in the right lobe of the liver measure up to 7.2 x 7.2 cm and most consistent with metastatic disease. There is no intrahepatic biliary ductal dilatation. The gallbladder is mildly distended. No calcified gallstone. No pericholecystic fluid. Pancreas: Unremarkable. No pancreatic ductal dilatation or surrounding inflammatory changes. Spleen: Normal in size without focal abnormality. Adrenals/Urinary Tract: Mildly thickened left adrenal gland may represent hyperplasia versus thickening. No discrete nodule. The right kidney is atrophic. Multiple right renal hypodense lesions are noted measuring up to 2.7 cm. These lesions demonstrate fluid attenuation most consistent with cysts. Subcentimeter right  renal hypodense lesions are too small to characterize but also likely represent cysts. There is cortical irregularity of the left kidney, likely related to prior infarct. There is a 2 cm left renal upper pole cyst. No hydronephrosis. The visualized ureters appear unremarkable. The urinary bladder is mildly distended. There is trabecular appearance of the bladder wall consistent with chronic bladder outlet obstruction. There is extension of the bladder into the upper right inguinal canal. Stomach/Bowel: There is irregular and thickened appearance of the rectal wall (series 2, image 93 and coronal series 6, image 81). There is sigmoid diverticulosis with muscular hypertrophy. Mild perisigmoid stranding likely represent chronic inflammation and scarring and less likely active inflammation. Moderate amount of stool noted throughout the colon and within the rectosigmoid. There is no evidence of bowel obstruction. Normal appendix.  Vascular/Lymphatic: There is advanced aortoiliac atherosclerotic disease. The origins of the celiac axis, SMA, IMA appear patent. The SMV, splenic vein, and main portal vein are patent. The IVC appears unremarkable. No portal venous gas identified. There is no adenopathy. Reproductive: The prostate gland is enlarged and heterogeneous measuring up to 6 cm in transverse axial diameter. Other: There are small left and moderate size right inguinal hernia containing fat. Musculoskeletal: There is degenerative changes of the spine. No acute fracture. IMPRESSION: 1. Thickened and irregular rectal wall concerning for a rectal mass. Correlation with clinical exam and sigmoidoscopy or colonoscopy recommended. There may be associated mass effect and luminal narrowing of the rectum with moderate amount of residual stool burden throughout the colon. 2. Sigmoid diverticulosis with evidence of chronic inflammation. No definite active inflammatory changes. 3. No bowel obstruction.  Normal appendix. 4. Hepatic hypodense lesions most consistent with metastatic disease. 5. Multiple pulmonary nodules as described concerning for metastatic disease. Nonemergent CT of the chest is recommended for complete evaluation of the lungs. 6.  Aortic Atherosclerosis (ICD10-I70.0). 7. Coronary vascular calcification. 8. Right renal atrophy. Electronically Signed   By: Anner Crete M.D.   On: 06/04/2017 19:25    EKG: Independently reviewed. nsr nonspecific tw flattening Old chart reviewed Case discussed with dr Wilson Singer  Assessment/Plan 83 yo male with rectal pain and bleeding for over 2 months found to have new rectal mass with mets  Principal Problem:   Rectal mass- will keep npo after midnight.  Hold anticoagulants.  GI consulted for likely scope tomorrow to get biopsy.  Will also consult general surgery as he may need debulking of tumor.  Start on norco low dose and advance if needed and as he tolerates.    Active Problems:    Rectal pain- due to mass   Hypertension- stable, holding hctz/arb due to mild aki   Hyperlipidemia- stalbe   Diabetes mellitus without complication ([place])- hold metformin, start SSI   AKI (acute kidney injury) ([place])- unknown what baseline, cr up to 1.6.  Hold htn meds, gentle ivf overnight and repeat    Have discussed code status with him and his only son/child.  He wishes to be a DNR, no intubation or CPR ever in the future.  I have brought up the possibility of surgical option to relieve some of his obstruction/pain of the tumor, he wishes to explore all options available to help relieve his discomfort.  DVT prophylaxis:  scds Code Status:   DNR Family Communication: son  Disposition Plan:  Per day team Consults called:   GI and gen surgery Admission status:    observation   Danuta Huseman A MD [facility]  If 7PM-7AM, please contact  night-coverage [email or web address] Password [facility]  06/04/2017, 8:51 PM

## 2017-06-04 NOTE — ED Notes (Signed)
Pt attempted to provide urine sample but was not able to do so at this time. Instructed family and pt to let nursing staff know when able to provide sample.

## 2017-06-04 NOTE — ED Triage Notes (Addendum)
Per son, pt evaluated at [facility] one month ago, VSS and pt sent home - Son states they did not help him- has not followed with Dr Demetrios Isaacs reports that pt has weakness and terrible hemorrhoids   Dr Luan Pulling is PCP

## 2017-06-04 NOTE — ED Notes (Signed)
Pt attempted to provide urine sample again but states he does not normally urinate often.

## 2017-06-04 NOTE — ED Provider Notes (Signed)
[facility] DEPT Provider Note   CSN: [id number] Arrival date & time: 06/04/17  1458     History   Chief Complaint Chief Complaint  Patient presents with  . Fatigue    HPI Andrew Madden is a 83 y.o. male.  HPI   [phone] with numerous complaints. Essentially failure to thrive for weeks-to-months. Rectal and back pain. Anorexia. Losing weight. He says he just has no appetite. Occasionally some nausea. No vomiting. No fever or chills. No urinary complaints. Reports was evaluated at [facility] about a month ago and told had hemorrhoids. Advised to FU with GI but hasnt yet. Symptoms worsening. Rectal pain worsening. Has been using a "hemorrhoid cream" but unsure of what specifically which hasnt helped.   Past Medical History:  Diagnosis Date  . Diabetes mellitus without complication ([place])   . Hyperlipidemia   . Hypertension   . Myocardial infarct ([place])     There are no active problems to display for this patient.   History reviewed. No pertinent surgical history.     Home Medications    Prior to Admission medications   Medication Sig Start Date End Date Taking? Authorizing Provider  acetaminophen (TYLENOL) 325 MG tablet Take 650 mg by mouth every 6 (six) hours as needed for mild pain, fever or headache.   Yes [provider]  aspirin 325 MG tablet Take 650 mg by mouth daily.    Yes [provider]  hydrocortisone 2.5 % cream Apply 1 application topically 2 (two) times daily. 05/23/17  Yes [provider]  losartan-hydrochlorothiazide (HYZAAR) 100-12.5 MG tablet Take 1 tablet by mouth daily. 03/17/17  Yes [provider]  metFORMIN (GLUMETZA) 500 MG (MOD) 24 hr tablet Take 500 mg by mouth daily with breakfast.   Yes [provider]  metoprolol tartrate (LOPRESSOR) 25 MG tablet Take 25 mg by mouth 2 (two) times daily.   Yes [provider]  PROCTOZONE-HC 2.5 % rectal cream Apply 1 application topically 2 (two) times daily.  05/03/17  Yes [provider]  simvastatin (ZOCOR) 40 MG tablet Take 20 mg by mouth at bedtime.    Yes [provider]  TRIPLE ANTIBIOTIC PLUS MAX ST 1 % OINT Apply 1 application topically 2 (two) times daily. 03/20/17  Yes [provider]    Family History History reviewed. No pertinent family history.  Social History Social History  Substance Use Topics  . Smoking status: Former Research scientist (life sciences)  . Smokeless tobacco: Never Used  . Alcohol use No     Allergies   Patient has no known allergies.   Review of Systems Review of Systems   Physical Exam Updated Vital Signs BP 131/68 (BP Location: Right Arm)   Pulse 77   Temp 98.1 F (36.7 C) (Oral)   Resp 18   Wt 70.3 kg (155 lb)   SpO2 94%   Physical Exam  Constitutional: He appears well-developed and well-nourished. No distress.  HENT:  Head: Normocephalic and atraumatic.  Eyes: Conjunctivae are normal. Right eye exhibits no discharge. Left eye exhibits no discharge.  Neck: Neck supple.  Cardiovascular: Normal rate, regular rhythm and normal heart sounds.  Exam reveals no gallop and no friction rub.   No murmur heard. Pulmonary/Chest: Effort normal and breath sounds normal. No respiratory distress.  Abdominal: Soft. He exhibits no distension. There is no tenderness.  Genitourinary:  Genitourinary Comments: Small nonthrombosed hemorrhoid. I could not do a DRE. Pt was in quite a bit of pain when attempting. It actually  felt more like a mechanical blockage though as opposed to him clenching. I could not readily visualize any significant mass externally. No blood noted.   Musculoskeletal: He exhibits no edema or tenderness.  Neurological: He is alert.  Skin: Skin is warm and dry.  Psychiatric: He has a normal mood and affect. His behavior is normal. Thought content normal.  Nursing note and vitals reviewed.    ED Treatments / Results  Labs (all labs ordered are listed, but only abnormal results are  displayed) Labs Reviewed  CBC WITH DIFFERENTIAL/PLATELET - Abnormal; Notable for the following:       Result Value   WBC 11.1 (*)    Hemoglobin 12.8 (*)    HCT 38.1 (*)    All other components within normal limits  BASIC METABOLIC PANEL - Abnormal; Notable for the following:    Glucose, Bld 143 (*)    BUN 29 (*)    Creatinine, Ser 1.59 (*)    GFR calc non Af Amer 38 (*)    GFR calc Af Amer 44 (*)    All other components within normal limits  CBG MONITORING, ED - Abnormal; Notable for the following:    Glucose-Capillary 132 (*)    All other components within normal limits  URINALYSIS, ROUTINE W REFLEX MICROSCOPIC    EKG  EKG Interpretation None       Radiology Ct Abdomen Pelvis W Contrast  Result Date: 06/04/2017 CLINICAL DATA:  83 year old male with rectal pain and weight loss. EXAM: CT ABDOMEN AND PELVIS WITH CONTRAST TECHNIQUE: Multidetector CT imaging of the abdomen and pelvis was performed using the standard protocol following bolus administration of intravenous contrast. CONTRAST:  10mL ISOVUE-300 IOPAMIDOL (ISOVUE-300) INJECTION 61% COMPARISON:  None. FINDINGS: Lower chest: There is an 8 mm right middle lobe pulmonary nodule. A 5 mm nodule is also seen in the right middle lobe. Small pulmonary nodules including a 4 mm left lower lobe as well as a 5 mm right lower lobe noted. Nonemergent CT of the chest is recommended for complete evaluation of the lungs. There is coronary vascular calcification primarily involving the LAD, and RCA. There is an area of calcification involving the inferior wall of the left ventricle which may be related to prior infarct. There is no intra-abdominal free air or free fluid. Hepatobiliary: There multiple hepatic hypodense lesions. The largest lesion or confluence of adjacent lesions in the right lobe of the liver measure up to 7.2 x 7.2 cm and most consistent with metastatic disease. There is no intrahepatic biliary ductal dilatation. The gallbladder  is mildly distended. No calcified gallstone. No pericholecystic fluid. Pancreas: Unremarkable. No pancreatic ductal dilatation or surrounding inflammatory changes. Spleen: Normal in size without focal abnormality. Adrenals/Urinary Tract: Mildly thickened left adrenal gland may represent hyperplasia versus thickening. No discrete nodule. The right kidney is atrophic. Multiple right renal hypodense lesions are noted measuring up to 2.7 cm. These lesions demonstrate fluid attenuation most consistent with cysts. Subcentimeter right renal hypodense lesions are too small to characterize but also likely represent cysts. There is cortical irregularity of the left kidney, likely related to prior infarct. There is a 2 cm left renal upper pole cyst. No hydronephrosis. The visualized ureters appear unremarkable. The urinary bladder is mildly distended. There is trabecular appearance of the bladder wall consistent with chronic bladder outlet obstruction. There is extension of the bladder into the upper right inguinal canal. Stomach/Bowel: There is irregular and thickened appearance of the rectal wall (series 2, image 93 and  coronal series 6, image 81). There is sigmoid diverticulosis with muscular hypertrophy. Mild perisigmoid stranding likely represent chronic inflammation and scarring and less likely active inflammation. Moderate amount of stool noted throughout the colon and within the rectosigmoid. There is no evidence of bowel obstruction. Normal appendix. Vascular/Lymphatic: There is advanced aortoiliac atherosclerotic disease. The origins of the celiac axis, SMA, IMA appear patent. The SMV, splenic vein, and main portal vein are patent. The IVC appears unremarkable. No portal venous gas identified. There is no adenopathy. Reproductive: The prostate gland is enlarged and heterogeneous measuring up to 6 cm in transverse axial diameter. Other: There are small left and moderate size right inguinal hernia containing fat.  Musculoskeletal: There is degenerative changes of the spine. No acute fracture. IMPRESSION: 1. Thickened and irregular rectal wall concerning for a rectal mass. Correlation with clinical exam and sigmoidoscopy or colonoscopy recommended. There may be associated mass effect and luminal narrowing of the rectum with moderate amount of residual stool burden throughout the colon. 2. Sigmoid diverticulosis with evidence of chronic inflammation. No definite active inflammatory changes. 3. No bowel obstruction.  Normal appendix. 4. Hepatic hypodense lesions most consistent with metastatic disease. 5. Multiple pulmonary nodules as described concerning for metastatic disease. Nonemergent CT of the chest is recommended for complete evaluation of the lungs. 6.  Aortic Atherosclerosis (ICD10-I70.0). 7. Coronary vascular calcification. 8. Right renal atrophy. Electronically Signed   By: Anner Crete M.D.   On: 06/04/2017 19:25    Procedures Procedures (including critical care time)  Medications Ordered in ED Medications  sodium chloride 0.9 % bolus 1,000 mL (0 mLs Intravenous Stopped 06/04/17 1757)  LORazepam (ATIVAN) injection 1 mg (1 mg Intravenous Given 06/04/17 1657)  lidocaine (XYLOCAINE) 2 % jelly 1 application (1 application Topical Given 06/04/17 1659)  ondansetron (ZOFRAN) injection 4 mg (4 mg Intravenous Given 06/04/17 1657)  iopamidol (ISOVUE-300) 61 % injection 75 mL (75 mLs Intravenous Contrast Given 06/04/17 1814)     Initial Impression / Assessment and Plan / ED Course  I have reviewed the triage vital signs and the nursing notes.  Pertinent labs & imaging results that were available during my care of the patient were reviewed by me and considered in my medical decision making (see chart for details).    88yM with failure through thrive and rectal pain. Unfortunately, appears to have metastatic CA. Explains weight loss, anorexia, fatigue, etc. I could not do a DRE for what seemed like  mechanical blockage although no mass visualized. Pt reports passing gas, but unsure of last BM. Abdomen is soft and nondistended. He denies vomiting. There probably is significant degree of stenosis but clinically not occluded completely. Discussed with Dr Gala Romney. GI will consult in am. Pt/son informed of findings. Pt unsure if he would want treatment but wants to know options and, if nothing else, symptomatic improvement.   Final Clinical Impressions(s) / ED Diagnoses   Final diagnoses:  Essential hypertension  Hyperlipidemia, unspecified hyperlipidemia type  Diabetes mellitus without complication ([place])  Rectal mass    New Prescriptions New Prescriptions   No medications on file     Virgel Manifold, MD 06/08/17 1037

## 2017-06-04 NOTE — ED Triage Notes (Signed)
Presents with fatigue, weight loss and lack of appetite. Per son he has not been eating and will only drink small amounts. PT reports that he doesn't have a taste for anything and has hemorrhoids that are very painful which makes him afraid to have a BM. Per son, "He is having a lot of problems getting around and not walking well at home. My mother and I have trouble and she can't really get up and down much. Before he had these hemorrhoids he was doing fine. His sugars have been high too, but our doctor told us to let him eat what he wants because he is 89" PT endorses weight loss of over 10 pounds this month.

## 2017-06-05 ENCOUNTER — Encounter (HOSPITAL_COMMUNITY)
Admission: EM | Disposition: A | Discharge status: Home Health | Payer: Self-pay | Source: Home / Self Care | Attending: Pulmonary Disease

## 2017-06-05 ENCOUNTER — Encounter (HOSPITAL_COMMUNITY): Payer: Self-pay | Admitting: Gastroenterology

## 2017-06-05 DIAGNOSIS — K624 Stenosis of anus and rectum: Secondary | ICD-10-CM | POA: Diagnosis present

## 2017-06-05 DIAGNOSIS — Z515 Encounter for palliative care: Secondary | ICD-10-CM | POA: Diagnosis not present

## 2017-06-05 DIAGNOSIS — Z66 Do not resuscitate: Secondary | ICD-10-CM | POA: Diagnosis present

## 2017-06-05 DIAGNOSIS — K629 Disease of anus and rectum, unspecified: Secondary | ICD-10-CM

## 2017-06-05 DIAGNOSIS — C787 Secondary malignant neoplasm of liver and intrahepatic bile duct: Secondary | ICD-10-CM | POA: Diagnosis present

## 2017-06-05 DIAGNOSIS — E119 Type 2 diabetes mellitus without complications: Secondary | ICD-10-CM | POA: Diagnosis present

## 2017-06-05 DIAGNOSIS — K6389 Other specified diseases of intestine: Secondary | ICD-10-CM

## 2017-06-05 DIAGNOSIS — Z7189 Other specified counseling: Secondary | ICD-10-CM | POA: Diagnosis not present

## 2017-06-05 DIAGNOSIS — E440 Moderate protein-calorie malnutrition: Secondary | ICD-10-CM | POA: Diagnosis present

## 2017-06-05 DIAGNOSIS — Z23 Encounter for immunization: Secondary | ICD-10-CM | POA: Diagnosis present

## 2017-06-05 DIAGNOSIS — R933 Abnormal findings on diagnostic imaging of other parts of digestive tract: Secondary | ICD-10-CM | POA: Diagnosis not present

## 2017-06-05 DIAGNOSIS — I252 Old myocardial infarction: Secondary | ICD-10-CM | POA: Diagnosis not present

## 2017-06-05 DIAGNOSIS — I10 Essential (primary) hypertension: Secondary | ICD-10-CM | POA: Diagnosis present

## 2017-06-05 DIAGNOSIS — Z87891 Personal history of nicotine dependence: Secondary | ICD-10-CM | POA: Diagnosis not present

## 2017-06-05 DIAGNOSIS — N179 Acute kidney failure, unspecified: Secondary | ICD-10-CM | POA: Diagnosis present

## 2017-06-05 DIAGNOSIS — C20 Malignant neoplasm of rectum: Secondary | ICD-10-CM | POA: Diagnosis present

## 2017-06-05 DIAGNOSIS — E785 Hyperlipidemia, unspecified: Secondary | ICD-10-CM | POA: Diagnosis present

## 2017-06-05 DIAGNOSIS — Z6824 Body mass index (BMI) 24.0-24.9, adult: Secondary | ICD-10-CM | POA: Diagnosis not present

## 2017-06-05 DIAGNOSIS — Z7982 Long term (current) use of aspirin: Secondary | ICD-10-CM | POA: Diagnosis not present

## 2017-06-05 HISTORY — PX: FLEXIBLE SIGMOIDOSCOPY: SHX5431

## 2017-06-05 LAB — GLUCOSE, CAPILLARY
Glucose-Capillary: 117 mg/dL — ABNORMAL HIGH (ref 65–99)
Glucose-Capillary: 124 mg/dL — ABNORMAL HIGH (ref 65–99)
Glucose-Capillary: 127 mg/dL — ABNORMAL HIGH (ref 65–99)
Glucose-Capillary: 141 mg/dL — ABNORMAL HIGH (ref 65–99)

## 2017-06-05 LAB — BASIC METABOLIC PANEL
ANION GAP: 8 (ref 5–15)
BUN: 24 mg/dL — ABNORMAL HIGH (ref 6–20)
CALCIUM: 8.5 mg/dL — AB (ref 8.9–10.3)
CO2: 27 mmol/L (ref 22–32)
Chloride: 103 mmol/L (ref 101–111)
Creatinine, Ser: 1.39 mg/dL — ABNORMAL HIGH (ref 0.61–1.24)
GFR calc non Af Amer: 44 mL/min — ABNORMAL LOW (ref >60)
GFR, EST AFRICAN AMERICAN: 51 mL/min — AB (ref >60)
Glucose, Bld: 127 mg/dL — ABNORMAL HIGH (ref 65–99)
Potassium: 4 mmol/L (ref 3.5–5.1)
Sodium: 138 mmol/L (ref 135–145)

## 2017-06-05 LAB — CBC
HCT: 35.1 % — ABNORMAL LOW (ref 39.0–52.0)
HEMOGLOBIN: 11.6 g/dL — AB (ref 13.0–17.0)
MCH: 29.3 pg (ref 26.0–34.0)
MCHC: 33 g/dL (ref 30.0–36.0)
MCV: 88.6 fL (ref 78.0–100.0)
PLATELETS: 165 10*3/uL (ref 150–400)
RBC: 3.96 MIL/uL — AB (ref 4.22–5.81)
RDW: 14 % (ref 11.5–15.5)
WBC: 9.1 10*3/uL (ref 4.0–10.5)

## 2017-06-05 SURGERY — SIGMOIDOSCOPY, FLEXIBLE
Anesthesia: Moderate Sedation

## 2017-06-05 MED ORDER — BOOST / RESOURCE BREEZE PO LIQD
1.0000 | Freq: Three times a day (TID) | ORAL | Status: DC
  Administered 2017-06-05 – 2017-06-09 (×4): 1 via ORAL

## 2017-06-05 MED ORDER — MEPERIDINE HCL 100 MG/ML IJ SOLN
INTRAMUSCULAR | Status: DC | PRN
  Administered 2017-06-05: 25 mg via INTRAVENOUS

## 2017-06-05 MED ORDER — ONDANSETRON HCL 4 MG/2ML IJ SOLN
INTRAMUSCULAR | Status: AC
  Filled 2017-06-05: qty 2

## 2017-06-05 MED ORDER — MIDAZOLAM HCL 5 MG/5ML IJ SOLN
INTRAMUSCULAR | Status: DC | PRN
  Administered 2017-06-05: 2 mg via INTRAVENOUS
  Administered 2017-06-05: 1 mg via INTRAVENOUS

## 2017-06-05 MED ORDER — MIDAZOLAM HCL 5 MG/5ML IJ SOLN
INTRAMUSCULAR | Status: AC
  Filled 2017-06-05: qty 5

## 2017-06-05 MED ORDER — ONDANSETRON HCL 4 MG/2ML IJ SOLN
INTRAMUSCULAR | Status: DC | PRN
  Administered 2017-06-05: 4 mg via INTRAVENOUS

## 2017-06-05 MED ORDER — LIDOCAINE HCL 2 % EX GEL
1.0000 "application " | CUTANEOUS | Status: DC | PRN
  Administered 2017-06-07 (×2): 1 via TOPICAL
  Filled 2017-06-05: qty 5

## 2017-06-05 MED ORDER — SODIUM CHLORIDE 0.9 % IV SOLN
INTRAVENOUS | Status: DC
  Administered 2017-06-05: 14:00:00 via INTRAVENOUS

## 2017-06-05 MED ORDER — TRAZODONE HCL 50 MG PO TABS
50.0000 mg | ORAL_TABLET | Freq: Every evening | ORAL | Status: DC | PRN
  Administered 2017-06-05: 50 mg via ORAL
  Filled 2017-06-05: qty 1

## 2017-06-05 MED ORDER — PRO-STAT SUGAR FREE PO LIQD
30.0000 mL | Freq: Three times a day (TID) | ORAL | Status: DC
  Administered 2017-06-05 – 2017-06-09 (×6): 30 mL via ORAL
  Filled 2017-06-05 (×7): qty 30

## 2017-06-05 MED ORDER — MEPERIDINE HCL 100 MG/ML IJ SOLN
INTRAMUSCULAR | Status: AC
  Filled 2017-06-05: qty 1

## 2017-06-05 NOTE — Progress Notes (Signed)
Initial Nutrition Assessment  DOCUMENTATION CODES:      INTERVENTION:  Boost Breeze po TID, each supplement provides 250 kcal and 9 grams of protein   ProStat 30 ml TID (provides 300 kcal, 45 gr protein) Which will help meet his protein goal while he is unable to fully advance his diet.  NUTRITION DIAGNOSIS:   Inadequate oral intake related to altered GI function as evidenced by per patient/family report.   GOAL:   Patient will meet greater than or equal to 90% of their needs   MONITOR:   Diet advancement, PO intake, Labs, Weight trends, Supplement acceptance  REASON FOR ASSESSMENT:   Malnutrition Screening Tool    ASSESSMENT:   Andrew Madden is an 83 yo who presents with rectal pain. Rectal mass identified and GI performed a flex sigmoidoscopy today. Oncology consulted. GI recommending a diverting colostomy. His wife and grandson are here and provided hx. The patient laid with his eyes closed most of RD visit. He is very hard of hearing per family. He follows a regular diet at home and is not a picky eater. No problems with chew / swallow. Andrew Madden walks with a cane and is able to feed himself. He has been 2 days with no solid food.  His reported usual body wt is around 80 kg. Unknown timeframe for wt change. Nutrition focused exam deferred until more opportune time.   Recent Labs Lab 06/04/17 1644 06/05/17 0517  NA 145 138  K 3.9 4.0  CL 103 103  CO2 28 27  BUN 29* 24*  CREATININE 1.59* 1.39*  CALCIUM 9.8 8.5*  GLUCOSE 143* 127*   labs: BUN has improved to 24, Cr 1.39.  CBG's range: 117-148.  CBG (last 3)   Recent Labs  06/04/17 2254 06/05/17 0729 06/05/17 1113  GLUCAP 148* 117* 124*      Diet Order:  Diet clear liquid Room service appropriate? Yes; Fluid consistency: Thin  Skin:  Reviewed, no issues  Last BM:  6/17  Height:   Ht Readings from Last 1 Encounters:  06/04/17 5\' 7"  (1.702 m)    Weight:   Wt Readings from Last 1 Encounters:   06/04/17 154 lb 12.7 oz (70.2 kg)    Ideal Body Weight:  67 kg  BMI:  Body mass index is 24.24 kg/m.  Estimated Nutritional Needs:   Kcal:  2000-2200   Protein:  80-85 gr  Fluid:  2.0-2.2 liters daily  EDUCATION NEEDS:   No education needs identified at this time  Colman Cater MS,RD,CSG,LDN Office: #[phone] Pager: [phone]

## 2017-06-05 NOTE — Op Note (Addendum)
[facility] Patient Name: Andrew Madden Procedure Date: 06/05/2017 12:49 PM MRN: [id number] Date of Birth: Sep 24, 1929 Attending MD: Norvel Richards , MD CSN: [id number] Age: 83 Admit Type: Inpatient Procedure:                Flexible Sigmoidoscopy Indications:              Abnormal CT of the GI tract Providers:                Norvel Richards, MD, Janeece Riggers, RN, Randa Spike, Technician Referring MD:              Medicines:                Midazolam 3 mg IV, Meperidine 25 mg IV, Ondansetron                            4 mg IV Complications:            No immediate complications. Estimated Blood Loss:     Estimated blood loss was minimal. Estimated blood                            loss was minimal. Procedure:                Pre-Anesthesia Assessment:                           - Prior to the procedure, a History and Physical                            was performed, and patient medications and                            allergies were reviewed. The patient's tolerance of                            previous anesthesia was also reviewed. The risks                            and benefits of the procedure and the sedation                            options and risks were discussed with the patient.                            All questions were answered, and informed consent                            was obtained. Prior Anticoagulants: The patient has                            taken no previous anticoagulant or antiplatelet  agents. ASA Grade Assessment: III - A patient with                            severe systemic disease. After reviewing the risks                            and benefits, the patient was deemed in                            satisfactory condition to undergo the procedure.                           After obtaining informed consent, the scope was                            passed under direct vision. The  [id number] was                            introduced through the anus and advanced to the the                            rectum. The flexible sigmoidoscopy was somewhat                            difficult due to inadequate bowel prep. The quality                            of the bowel preparation was inadequate. Scope In: Scope Out: Findings:      The digital rectal exam findings include anal stricture and rectal       stricture - nearly obstructing. Compromise lumen barely accommodated the       pediatric colonoscope..      A fungating completely nearly obstructing mass was found in the rectum.       The mass was circumferential. Oozing was present. rectum somewhat       dilated. Much stool in the rectal vault. No attempts at furthering the       exam made. This was biopsied with a cold forceps for histology.       Estimated blood loss was minimal. Impression:               - Preparation of the colon was inadequate.                           - Anal stricture and rectal stricture found on                            digital rectal exam.                           - Nearly completely obstructing tumor in the                            rectum. Biopsied. Recommend diverting colostomy to  relieve obstruction. Oncology consultation. Moderate Sedation:      Moderate (conscious) sedation was administered by the endoscopy nurse       and supervised by the endoscopist. The following parameters were       monitored: oxygen saturation, heart rate, blood pressure, respiratory       rate, EKG, adequacy of pulmonary ventilation, and response to care.       Total physician intraservice time was 10 minutes. Recommendation:           - Return patient to hospital ward for ongoing care. Procedure Code(s):        --- Professional ---                           [phone], 41, Sigmoidoscopy, flexible; with biopsy,                            single or multiple                            99152, Moderate sedation services provided by the                            same physician or other qualified health care                            professional performing the diagnostic or                            therapeutic service that the sedation supports,                            requiring the presence of an independent trained                            observer to assist in the monitoring of the                            patient's level of consciousness and physiological                            status; initial 15 minutes of intraservice time,                            patient age 25 years or older Diagnosis Code(s):        --- Professional ---                           K62.4, Stenosis of anus and rectum                           D49.0, Neoplasm of unspecified behavior of                            digestive system  R93.3, Abnormal findings on diagnostic imaging of                            other parts of digestive tract CPT copyright 2016 [employer]. All rights reserved. The codes documented in this report are preliminary and upon coder review may  be revised to meet current compliance requirements. Cristopher Estimable. Xzayvier Fagin, MD Norvel Richards, MD 06/05/2017 2:49:36 PM This report has been signed electronically. Number of Addenda: 0

## 2017-06-05 NOTE — Progress Notes (Signed)
Subjective: He was admitted to the hospital yesterday with a rectal mass presumably a cancer and what appears to be metastatic disease. He's having trouble having a bowel movement. He has lost approximately 30 pounds in the last 3 months.  Objective: Vital signs in last 24 hours: Temp:  [97.9 F (36.6 C)-98.4 F (36.9 C)] 98.4 F (36.9 C) (06/18 0609) Pulse Rate:  [70-94] 77 (06/18 0609) Resp:  [16-18] 16 (06/18 0609) BP: (117-156)/(58-94) 156/67 (06/18 0609) SpO2:  [94 %-100 %] 100 % (06/18 0609) Weight:  [70.2 kg (154 lb 12.7 oz)-70.3 kg (155 lb)] 70.2 kg (154 lb 12.7 oz) (06/17 2252) Weight change:  Last BM Date: 06/04/17  Intake/Output from previous day: 06/17 0701 - 06/18 0700 In: 1552.5 [I.V.:552.5; IV Piggyback:1000] Out: -   PHYSICAL EXAM General appearance: alert, cooperative, mild distress and He has clearly lost weight since I saw him last Resp: clear to auscultation bilaterally Cardio: regular rate and rhythm, S1, S2 normal, no murmur, click, rub or gallop GI: soft, non-tender; bowel sounds normal; no masses,  no organomegaly Extremities: extremities normal, atraumatic, no cyanosis or edema Skin warm and dry  Lab Results:  Results for orders placed or performed during the hospital encounter of 06/04/17 (from the past 48 hour(s))  POC CBG, ED     Status: Abnormal   Collection Time: 06/04/17  3:55 PM  Result Value Ref Range   Glucose-Capillary 132 (H) 65 - 99 mg/dL  CBC with Differential     Status: Abnormal   Collection Time: 06/04/17  4:44 PM  Result Value Ref Range   WBC 11.1 (H) 4.0 - 10.5 K/uL   RBC 4.37 4.22 - 5.81 MIL/uL   Hemoglobin 12.8 (L) 13.0 - 17.0 g/dL   HCT 38.1 (L) 39.0 - 52.0 %   MCV 87.2 78.0 - 100.0 fL   MCH 29.3 26.0 - 34.0 pg   MCHC 33.6 30.0 - 36.0 g/dL   RDW 14.1 11.5 - 15.5 %   Platelets 203 150 - 400 K/uL   Neutrophils Relative % 70 %   Neutro Abs 7.7 1.7 - 7.7 K/uL   Lymphocytes Relative 22 %   Lymphs Abs 2.5 0.7 - 4.0 K/uL    Monocytes Relative 7 %   Monocytes Absolute 0.8 0.1 - 1.0 K/uL   Eosinophils Relative 1 %   Eosinophils Absolute 0.1 0.0 - 0.7 K/uL   Basophils Relative 0 %   Basophils Absolute 0.0 0.0 - 0.1 K/uL  Basic metabolic panel     Status: Abnormal   Collection Time: 06/04/17  4:44 PM  Result Value Ref Range   Sodium 145 135 - 145 mmol/L   Potassium 3.9 3.5 - 5.1 mmol/L   Chloride 103 101 - 111 mmol/L   CO2 28 22 - 32 mmol/L   Glucose, Bld 143 (H) 65 - 99 mg/dL   BUN 29 (H) 6 - 20 mg/dL   Creatinine, Ser 1.59 (H) 0.61 - 1.24 mg/dL   Calcium 9.8 8.9 - 10.3 mg/dL   GFR calc non Af Amer 38 (L) >60 mL/min   GFR calc Af Amer 44 (L) >60 mL/min    Comment: (NOTE) The eGFR has been calculated using the CKD EPI equation. This calculation has not been validated in all clinical situations. eGFR's persistently <60 mL/min signify possible Chronic Kidney Disease.    Anion gap 14 5 - 15  Urinalysis, Routine w reflex microscopic     Status: Abnormal   Collection Time: 06/04/17  8:20 PM  Result Value Ref Range   Color, Urine YELLOW YELLOW   APPearance CLEAR CLEAR   Specific Gravity, Urine 1.038 (H) 1.005 - 1.030   pH 6.0 5.0 - 8.0   Glucose, UA NEGATIVE NEGATIVE mg/dL   Hgb urine dipstick NEGATIVE NEGATIVE   Bilirubin Urine NEGATIVE NEGATIVE   Ketones, ur 5 (A) NEGATIVE mg/dL   Protein, ur NEGATIVE NEGATIVE mg/dL   Nitrite NEGATIVE NEGATIVE   Leukocytes, UA NEGATIVE NEGATIVE  Glucose, capillary     Status: Abnormal   Collection Time: 06/04/17 10:54 PM  Result Value Ref Range   Glucose-Capillary 148 (H) 65 - 99 mg/dL  Basic metabolic panel     Status: Abnormal   Collection Time: 06/05/17  5:17 AM  Result Value Ref Range   Sodium 138 135 - 145 mmol/L    Comment: DELTA CHECK NOTED   Potassium 4.0 3.5 - 5.1 mmol/L   Chloride 103 101 - 111 mmol/L   CO2 27 22 - 32 mmol/L   Glucose, Bld 127 (H) 65 - 99 mg/dL   BUN 24 (H) 6 - 20 mg/dL   Creatinine, Ser 1.39 (H) 0.61 - 1.24 mg/dL   Calcium 8.5  (L) 8.9 - 10.3 mg/dL   GFR calc non Af Amer 44 (L) >60 mL/min   GFR calc Af Amer 51 (L) >60 mL/min    Comment: (NOTE) The eGFR has been calculated using the CKD EPI equation. This calculation has not been validated in all clinical situations. eGFR's persistently <60 mL/min signify possible Chronic Kidney Disease.    Anion gap 8 5 - 15  CBC     Status: Abnormal   Collection Time: 06/05/17  5:17 AM  Result Value Ref Range   WBC 9.1 4.0 - 10.5 K/uL   RBC 3.96 (L) 4.22 - 5.81 MIL/uL   Hemoglobin 11.6 (L) 13.0 - 17.0 g/dL   HCT 35.1 (L) 39.0 - 52.0 %   MCV 88.6 78.0 - 100.0 fL   MCH 29.3 26.0 - 34.0 pg   MCHC 33.0 30.0 - 36.0 g/dL   RDW 14.0 11.5 - 15.5 %   Platelets 165 150 - 400 K/uL  Glucose, capillary     Status: Abnormal   Collection Time: 06/05/17  7:29 AM  Result Value Ref Range   Glucose-Capillary 117 (H) 65 - 99 mg/dL    ABGS No results for input(s): PHART, PO2ART, TCO2, HCO3 in the last 72 hours.  Invalid input(s): PCO2 CULTURES No results found for this or any previous visit (from the past 240 hour(s)). Studies/Results: Ct Abdomen Pelvis W Contrast  Result Date: 06/04/2017 CLINICAL DATA:  83 year old male with rectal pain and weight loss. EXAM: CT ABDOMEN AND PELVIS WITH CONTRAST TECHNIQUE: Multidetector CT imaging of the abdomen and pelvis was performed using the standard protocol following bolus administration of intravenous contrast. CONTRAST:  58m ISOVUE-300 IOPAMIDOL (ISOVUE-300) INJECTION 61% COMPARISON:  None. FINDINGS: Lower chest: There is an 8 mm right middle lobe pulmonary nodule. A 5 mm nodule is also seen in the right middle lobe. Small pulmonary nodules including a 4 mm left lower lobe as well as a 5 mm right lower lobe noted. Nonemergent CT of the chest is recommended for complete evaluation of the lungs. There is coronary vascular calcification primarily involving the LAD, and RCA. There is an area of calcification involving the inferior wall of the left  ventricle which may be related to prior infarct. There is no intra-abdominal free air or free fluid. Hepatobiliary: There multiple hepatic  hypodense lesions. The largest lesion or confluence of adjacent lesions in the right lobe of the liver measure up to 7.2 x 7.2 cm and most consistent with metastatic disease. There is no intrahepatic biliary ductal dilatation. The gallbladder is mildly distended. No calcified gallstone. No pericholecystic fluid. Pancreas: Unremarkable. No pancreatic ductal dilatation or surrounding inflammatory changes. Spleen: Normal in size without focal abnormality. Adrenals/Urinary Tract: Mildly thickened left adrenal gland may represent hyperplasia versus thickening. No discrete nodule. The right kidney is atrophic. Multiple right renal hypodense lesions are noted measuring up to 2.7 cm. These lesions demonstrate fluid attenuation most consistent with cysts. Subcentimeter right renal hypodense lesions are too small to characterize but also likely represent cysts. There is cortical irregularity of the left kidney, likely related to prior infarct. There is a 2 cm left renal upper pole cyst. No hydronephrosis. The visualized ureters appear unremarkable. The urinary bladder is mildly distended. There is trabecular appearance of the bladder wall consistent with chronic bladder outlet obstruction. There is extension of the bladder into the upper right inguinal canal. Stomach/Bowel: There is irregular and thickened appearance of the rectal wall (series 2, image 93 and coronal series 6, image 81). There is sigmoid diverticulosis with muscular hypertrophy. Mild perisigmoid stranding likely represent chronic inflammation and scarring and less likely active inflammation. Moderate amount of stool noted throughout the colon and within the rectosigmoid. There is no evidence of bowel obstruction. Normal appendix. Vascular/Lymphatic: There is advanced aortoiliac atherosclerotic disease. The origins of the  celiac axis, SMA, IMA appear patent. The SMV, splenic vein, and main portal vein are patent. The IVC appears unremarkable. No portal venous gas identified. There is no adenopathy. Reproductive: The prostate gland is enlarged and heterogeneous measuring up to 6 cm in transverse axial diameter. Other: There are small left and moderate size right inguinal hernia containing fat. Musculoskeletal: There is degenerative changes of the spine. No acute fracture. IMPRESSION: 1. Thickened and irregular rectal wall concerning for a rectal mass. Correlation with clinical exam and sigmoidoscopy or colonoscopy recommended. There may be associated mass effect and luminal narrowing of the rectum with moderate amount of residual stool burden throughout the colon. 2. Sigmoid diverticulosis with evidence of chronic inflammation. No definite active inflammatory changes. 3. No bowel obstruction.  Normal appendix. 4. Hepatic hypodense lesions most consistent with metastatic disease. 5. Multiple pulmonary nodules as described concerning for metastatic disease. Nonemergent CT of the chest is recommended for complete evaluation of the lungs. 6.  Aortic Atherosclerosis (ICD10-I70.0). 7. Coronary vascular calcification. 8. Right renal atrophy. Electronically Signed   By: Anner Crete M.D.   On: 06/04/2017 19:25    Medications:  Prior to Admission:  Prescriptions Prior to Admission  Medication Sig Dispense Refill Last Dose  . acetaminophen (TYLENOL) 325 MG tablet Take 650 mg by mouth every 6 (six) hours as needed for mild pain, fever or headache.   06/04/2017 at 0930  . aspirin 325 MG tablet Take 650 mg by mouth daily.    06/03/2017 at 1300  . hydrocortisone 2.5 % cream Apply 1 application topically 2 (two) times daily.   06/03/2017 at Unknown time  . losartan-hydrochlorothiazide (HYZAAR) 100-12.5 MG tablet Take 1 tablet by mouth daily.   06/03/2017 at 1300  . metFORMIN (GLUMETZA) 500 MG (MOD) 24 hr tablet Take 500 mg by mouth daily  with breakfast.   06/03/2017 at Unknown time  . metoprolol tartrate (LOPRESSOR) 25 MG tablet Take 25 mg by mouth 2 (two) times daily.   06/03/2017  at 1300  . PROCTOZONE-HC 2.5 % rectal cream Apply 1 application topically 2 (two) times daily.   06/04/2017 at Unknown time  . simvastatin (ZOCOR) 40 MG tablet Take 20 mg by mouth at bedtime.    06/03/2017 at Unknown time  . TRIPLE ANTIBIOTIC PLUS MAX ST 1 % OINT Apply 1 application topically 2 (two) times daily.   06/03/2017 at Unknown time   Scheduled: . feeding supplement (ENSURE ENLIVE)  237 mL Oral BID BM  . hydrocortisone  1 application Topical BID  . insulin aspart  0-9 Units Subcutaneous TID WC  . metoprolol tartrate  25 mg Oral BID  . pneumococcal 23 valent vaccine  0.5 mL Intramuscular Tomorrow-1000  . simvastatin  20 mg Oral QHS   Continuous: . sodium chloride 75 mL/hr at 06/04/17 2247   RRN:[id number], HYDROcodone-acetaminophen, ondansetron **OR** ondansetron (ZOFRAN) IV  Assesment: He was admitted with a rectal mass. This appears to be a cancer with metastatic disease. At baseline he has hypertension which is pretty well controlled, diabetes which is also been pretty well controlled. He has rectal pain. Principal Problem:   Rectal mass Active Problems:   Rectal pain   Hypertension   Hyperlipidemia   Diabetes mellitus without complication (HCC)   AKI (acute kidney injury) ([place])    Plan: Request GI and surgical consults. I would request oncology consultation but I discussed the situation with his son at bedside and he says that the family would prefer to obtain oncology treatment in [facility] because it is much closer to their home    LOS: 0 days   [facility] Malena L 06/05/2017, 8:22 AM

## 2017-06-05 NOTE — Progress Notes (Signed)
Received verbal order from Dr. Luan Pulling for lidocaine jelly 2% Q4hr PRN and for trazodone 50 mg QHS PRN.

## 2017-06-05 NOTE — Consult Note (Signed)
Referring Provider: Dr. Shanon Brow  Primary Care Physician:  Sinda Du, MD Primary Gastroenterologist:  Dr. Gala Romney   Date of Admission: 06/04/17 Date of Consultation: 06/05/17  Reason for Consultation: Rectal Mass   HPI:  Andrew Madden is an 83 y.o. year old male presenting to the ED with complaints of rectal pain, difficulty with constipation and unable to pass formed BM. Notes for the past 2-3 months, he has had only pencil-thin stool, unproductive, and notes only smears with wiping at this point. No rectal bleeding. Family member at bedside believes he has lost "30 lbs in one month". Poor appetite. No prior colonoscopy. During this time, he has felt that his hemorrhoids were the culprit for his pain and difficulty with constipation. No known family history of colorectal cancer. No prior history of colonoscopy. Patient is NPO since midnight.   CT abd/pelvis with contrast in ED reveal thickened and irregular rectal wall concerning for rectal mass. Liver lesions and pulmonary nodules concerning for metastatic disease. Mild normocytic anemia on presentation. CEA pending. DRE attempted in ED but unable to be completed.   Past Medical History:  Diagnosis Date  . Diabetes mellitus without complication ([place])   . Hyperlipidemia   . Hypertension   . Myocardial infarct [facility])     Past Surgical History:  Procedure Laterality Date  . None      Prior to Admission medications   Medication Sig Start Date End Date Taking? Authorizing Provider  acetaminophen (TYLENOL) 325 MG tablet Take 650 mg by mouth every 6 (six) hours as needed for mild pain, fever or headache.   Yes [provider]  aspirin 325 MG tablet Take 650 mg by mouth daily.    Yes [provider]  hydrocortisone 2.5 % cream Apply 1 application topically 2 (two) times daily. 05/23/17  Yes [provider]  losartan-hydrochlorothiazide (HYZAAR) 100-12.5 MG tablet Take 1 tablet by mouth daily. 03/17/17  Yes [provider]  metFORMIN (GLUMETZA) 500 MG (MOD) 24 hr tablet Take 500 mg by mouth daily with breakfast.   Yes [provider]  metoprolol tartrate (LOPRESSOR) 25 MG tablet Take 25 mg by mouth 2 (two) times daily.   Yes [provider]  PROCTOZONE-HC 2.5 % rectal cream Apply 1 application topically 2 (two) times daily. 05/03/17  Yes [provider]  simvastatin (ZOCOR) 40 MG tablet Take 20 mg by mouth at bedtime.    Yes [provider]  TRIPLE ANTIBIOTIC PLUS MAX ST 1 % OINT Apply 1 application topically 2 (two) times daily. 03/20/17  Yes [provider]    Current Facility-Administered Medications  Medication Dose Route Frequency Provider Last Rate Last Dose  . acetaminophen (TYLENOL) tablet 650 mg  650 mg Oral Q6H PRN Phillips Grout, MD      . feeding supplement (ENSURE ENLIVE) (ENSURE ENLIVE) liquid 237 mL  237 mL Oral BID BM Sinda Du, MD      . HYDROcodone-acetaminophen (NORCO/VICODIN) 5-325 MG per tablet 1-2 tablet  1-2 tablet Oral Q4H PRN Phillips Grout, MD      . hydrocortisone (ANUSOL-HC) 2.5 % rectal cream 1 application  1 application Topical BID Derrill Kay A, MD      . insulin aspart (novoLOG) injection 0-9 Units  0-9 Units Subcutaneous TID WC Derrill Kay A, MD      . metoprolol tartrate (LOPRESSOR) tablet 25 mg  25 mg Oral BID Phillips Grout, MD      . ondansetron [facility]) tablet 4  mg  4 mg Oral Q6H PRN Phillips Grout, MD       Or  . ondansetron [facility]) injection 4 mg  4 mg Intravenous Q6H PRN Derrill Kay A, MD      . pneumococcal 23 valent vaccine (PNU-IMMUNE) injection 0.5 mL  0.5 mL Intramuscular Tomorrow-1000 Sinda Du, MD      . simvastatin (ZOCOR) tablet 20 mg  20 mg Oral QHS Phillips Grout, MD        Allergies as of 06/04/2017  . (No Known Allergies)    Family History  Problem Relation Age of Onset  . Colon cancer Neg Hx     Social History   Social History  . Marital status: Married    Spouse  name: N/A  . Number of children: N/A  . Years of education: N/A   Occupational History  . Not on file.   Social History Main Topics  . Smoking status: Former Research scientist (life sciences)  . Smokeless tobacco: Never Used  . Alcohol use No  . Drug use: No  . Sexual activity: Not on file   Other Topics Concern  . Not on file   Social History Narrative  . No narrative on file    Review of Systems: Gen: see HPI  CV: Denies chest pain, heart palpitations, syncope, edema  Resp: Denies shortness of breath with rest, cough, wheezing GI: see HPI  GU : Denies urinary burning, urinary frequency, urinary incontinence.  MS: Denies joint pain,swelling, cramping Derm: Denies rash, itching, dry skin Psych: Denies depression, anxiety,confusion, or memory loss Heme: see HPI   Physical Exam: Vital signs in last 24 hours: Temp:  [97.9 F (36.6 C)-98.4 F (36.9 C)] 98.4 F (36.9 C) (06/18 0609) Pulse Rate:  [70-94] 77 (06/18 0609) Resp:  [16-18] 16 (06/18 0609) BP: (117-156)/(58-94) 156/67 (06/18 0609) SpO2:  [94 %-100 %] 100 % (06/18 0609) Weight:  [154 lb 12.7 oz (70.2 kg)-155 lb (70.3 kg)] 154 lb 12.7 oz (70.2 kg) (06/17 2252) Last BM Date: 06/04/17 General:   Alert,  Well-developed, well-nourished, in obvious discomfort with rectal pain.  Head:  Normocephalic and atraumatic. Eyes:  Sclera clear, no icterus.    Ears:  Extremely hard of hearing.  Nose:  No deformity, discharge,  or lesions. Mouth:  No deformity or lesions, dentition normal. Lungs:  Clear throughout to auscultation.  Heart:  S1 S2 present without murmurs Abdomen:  +BS, full but soft, nontender and nondistended. No masses, hepatosplenomegaly or hernias noted. Normal bowel sounds, without guarding, and without rebound.   Rectal: External exam with non-thrombosed hemorrhoids. Incomplete DRE due to significant pain and discomfort. Only able to insert just the tip of the finger into rectum Msk:  Symmetrical without gross deformities. Normal  posture. Extremities:  Without  edema. Neurologic:  Alert and  oriented x4 Psych:  Alert and cooperative. Normal mood and affect.  Intake/Output from previous day: 06/17 0701 - 06/18 0700 In: 1552.5 [I.V.:552.5; IV Piggyback:1000] Out: -  Intake/Output this shift: No intake/output data recorded.  Lab Results:  Recent Labs  06/04/17 1644 06/05/17 0517  WBC 11.1* 9.1  HGB 12.8* 11.6*  HCT 38.1* 35.1*  PLT 203 165   BMET  Recent Labs  06/04/17 1644 06/05/17 0517  NA 145 138  K 3.9 4.0  CL 103 103  CO2 28 27  GLUCOSE 143* 127*  BUN 29* 24*  CREATININE 1.59* 1.39*  CALCIUM 9.8 8.5*    Studies/Results: Ct Abdomen Pelvis W Contrast  Result Date:  06/04/2017 CLINICAL DATA:  83 year old male with rectal pain and weight loss. EXAM: CT ABDOMEN AND PELVIS WITH CONTRAST TECHNIQUE: Multidetector CT imaging of the abdomen and pelvis was performed using the standard protocol following bolus administration of intravenous contrast. CONTRAST:  92mL ISOVUE-300 IOPAMIDOL (ISOVUE-300) INJECTION 61% COMPARISON:  None. FINDINGS: Lower chest: There is an 8 mm right middle lobe pulmonary nodule. A 5 mm nodule is also seen in the right middle lobe. Small pulmonary nodules including a 4 mm left lower lobe as well as a 5 mm right lower lobe noted. Nonemergent CT of the chest is recommended for complete evaluation of the lungs. There is coronary vascular calcification primarily involving the LAD, and RCA. There is an area of calcification involving the inferior wall of the left ventricle which may be related to prior infarct. There is no intra-abdominal free air or free fluid. Hepatobiliary: There multiple hepatic hypodense lesions. The largest lesion or confluence of adjacent lesions in the right lobe of the liver measure up to 7.2 x 7.2 cm and most consistent with metastatic disease. There is no intrahepatic biliary ductal dilatation. The gallbladder is mildly distended. No calcified gallstone. No  pericholecystic fluid. Pancreas: Unremarkable. No pancreatic ductal dilatation or surrounding inflammatory changes. Spleen: Normal in size without focal abnormality. Adrenals/Urinary Tract: Mildly thickened left adrenal gland may represent hyperplasia versus thickening. No discrete nodule. The right kidney is atrophic. Multiple right renal hypodense lesions are noted measuring up to 2.7 cm. These lesions demonstrate fluid attenuation most consistent with cysts. Subcentimeter right renal hypodense lesions are too small to characterize but also likely represent cysts. There is cortical irregularity of the left kidney, likely related to prior infarct. There is a 2 cm left renal upper pole cyst. No hydronephrosis. The visualized ureters appear unremarkable. The urinary bladder is mildly distended. There is trabecular appearance of the bladder wall consistent with chronic bladder outlet obstruction. There is extension of the bladder into the upper right inguinal canal. Stomach/Bowel: There is irregular and thickened appearance of the rectal wall (series 2, image 93 and coronal series 6, image 81). There is sigmoid diverticulosis with muscular hypertrophy. Mild perisigmoid stranding likely represent chronic inflammation and scarring and less likely active inflammation. Moderate amount of stool noted throughout the colon and within the rectosigmoid. There is no evidence of bowel obstruction. Normal appendix. Vascular/Lymphatic: There is advanced aortoiliac atherosclerotic disease. The origins of the celiac axis, SMA, IMA appear patent. The SMV, splenic vein, and main portal vein are patent. The IVC appears unremarkable. No portal venous gas identified. There is no adenopathy. Reproductive: The prostate gland is enlarged and heterogeneous measuring up to 6 cm in transverse axial diameter. Other: There are small left and moderate size right inguinal hernia containing fat. Musculoskeletal: There is degenerative changes of the  spine. No acute fracture. IMPRESSION: 1. Thickened and irregular rectal wall concerning for a rectal mass. Correlation with clinical exam and sigmoidoscopy or colonoscopy recommended. There may be associated mass effect and luminal narrowing of the rectum with moderate amount of residual stool burden throughout the colon. 2. Sigmoid diverticulosis with evidence of chronic inflammation. No definite active inflammatory changes. 3. No bowel obstruction.  Normal appendix. 4. Hepatic hypodense lesions most consistent with metastatic disease. 5. Multiple pulmonary nodules as described concerning for metastatic disease. Nonemergent CT of the chest is recommended for complete evaluation of the lungs. 6.  Aortic Atherosclerosis (ICD10-I70.0). 7. Coronary vascular calcification. 8. Right renal atrophy. Electronically Signed   By: Laren Everts.D.  On: 06/04/2017 19:25    Impression: 83 year old male presenting with rectal pain, weight loss, change in bowel habits, and CT concerning for rectal mass with metastatic disease. DRE attempted by both ED physician and myself, with difficulty in completing exam due to narrowing and significant pain. Will attempt to obtain biopsies via flexible sigmoidoscopy today utilizing pediatric scope. He has been NPO since midnight. He should be appropriate for conscious sedation without need for Propofol. Discussed with patient and family member at bedside, reviewing risks and benefits. They stated understanding.   Plan: Remain NPO Flexible sigmoidoscopy today with pediatric colonoscope CEA pending Surgical consult requested per admitting physician  Patient and family desire Oncology team at [facility] due to transportation   Annitta Needs, PhD, ANP-BC [facility]     LOS: 0 days    06/05/2017, 10:06 AM

## 2017-06-05 NOTE — Care Management Note (Signed)
Case Management Note  Patient Details  Name: Andrew Madden MRN: [id number] Date of Birth: 06-25-1929  Subjective/Objective:                  Admitted with rectal mass. Pt from home, wife at bedisde, patient HOR, wife to provide pt info. Pt ind with ADL's. Pt has PCP, transportation and insurance with drug coverage. Pt/wife communicates no needs.   Action/Plan: DC home with self care. CM will cont to follow to DC.   Expected Discharge Date:    06/06/2017              Expected Discharge Plan:  Home/Self Care  In-House Referral:  NA  Discharge planning Services  CM Consult  Post Acute Care Choice:  NA Choice offered to:  NA  Status of Service:  Completed, signed off  Sherald Barge, RN 06/05/2017, 9:42 AM

## 2017-06-06 ENCOUNTER — Encounter (HOSPITAL_COMMUNITY): Payer: Self-pay | Admitting: Primary Care

## 2017-06-06 DIAGNOSIS — Z7189 Other specified counseling: Secondary | ICD-10-CM

## 2017-06-06 DIAGNOSIS — Z515 Encounter for palliative care: Secondary | ICD-10-CM

## 2017-06-06 LAB — GLUCOSE, CAPILLARY
GLUCOSE-CAPILLARY: 136 mg/dL — AB (ref 65–99)
GLUCOSE-CAPILLARY: 191 mg/dL — AB (ref 65–99)
GLUCOSE-CAPILLARY: 160 mg/dL — AB (ref 65–99)
Glucose-Capillary: 150 mg/dL — ABNORMAL HIGH (ref 65–99)

## 2017-06-06 LAB — CEA: CEA: 533.5 ng/mL — ABNORMAL HIGH (ref 0.0–4.7)

## 2017-06-06 MED ORDER — ALPRAZOLAM 0.25 MG PO TABS
0.2500 mg | ORAL_TABLET | Freq: Every evening | ORAL | Status: DC | PRN
  Administered 2017-06-06 – 2017-06-08 (×3): 0.25 mg via ORAL
  Filled 2017-06-06 (×3): qty 1

## 2017-06-06 MED ORDER — ALPRAZOLAM 0.25 MG PO TABS: 0.2500 mg | ORAL_TABLET | Freq: Three times a day (TID) | ORAL | Status: DC | PRN

## 2017-06-06 NOTE — Consult Note (Signed)
Consultation Note Date: 06/06/2017   Patient Name: Andrew Madden  DOB: February 06, 1929  MRN: [id number]  Age / Sex: 83 y.o., male  PCP: Sinda Du, MD Referring Physician: Sinda Du, MD  Reason for Consultation: Establishing goals of care and Psychosocial/spiritual support  HPI/Patient Profile: 83 y.o. 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   Clinical Assessment and Goals of Care: Andrew Madden is resting quietly in bed. He greets me making and keeping eye contact as I enter. He is extremely hard of hearing. Present today at bedside is Andrew Madden and Andrew Madden. We talk about Andrew Madden chronic health problems which are quite limited. He has a history of high blood pressure and cholesterol and a heart attack 10 to 15 years ago. 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   We talk about this new diagnosis of cancer and the expected surgery on Thursday.  At first Andrew Madden is not interested in listening. I share with him that he is scheduled for surgery on Thursday, I share that he will have his stomach cut, and a bag to catch "dookie". He clearly states, "I'd rather die", while looking at his Andrew. His Andrew of 20 years states that he will have surgery.  Andrew Madden is currently battling lung cancer.  We talk about his functional status. Andrew states that he is unable to get up in the evenings to toilet himself, and their Andrew Madden has been staying overnight. She states that he is very weak at this point, and she wants him to have surgery. I share my concerns that after surgery, Andrew Madden will be even weaker. I share that he would be eligible to  go to rehab for a few weeks, but family states that he would not accept rehab.   Andrew Madden states that approximately 3 months ago he started feeling bad. He states that he went to the physician but did not know that he had cancer. They state that he has lost 30 to 40 pounds during this 3 months. Their understanding is that he has an aggressive cancer. I share a diagram of the chronic illness pathway, what is normal and expected. I also share the difference in the chronic illness pathway for someone who has cancer.  Andrew Madden is tearful several times during our conversation. He states several times that he would "rather die", he goes further to say that he has "had a good life", that he knows where he is going. 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 He shares that he tried to help people.   We talk about prognosis.  Family states that they are unaware of prognosis. I share that  knowing how much time Andrew Madden has may help with the decision about surgery. Andrew and Andrew agree. At the end of our conversation Andrew Madden states that only Andrew Madden can decide if he wants surgery or not. We talk about what would happen if he does not take surgery. I share that he would need to go home with hospice. Andrew is the Andrew Madden, and she is familiar with hospice. We talk about time being short, weeks not months. We also talk about symptom management, comfort and dignity.  Conference with NP at [facility] regarding prognosis. Conference with Dr. Luan Pulling.  I returned later in the day to talk with Andrew Madden, his Andrew, and Andrew Madden regarding prognosis. I meet with Andrew Madden and Andrew Madden prior to meeting with Andrew Madden.  With permission, I share prognosis. We talk about possibilities, my worry about surgery, and what is possible for treatment if Andrew Madden declined surgery. Andrew Madden states flatly that she would not accept hospice in the home. She  states that she and her family will be able to care for Andrew Madden and she anticipates Dr. Luan Pulling could write medication for comfort.  I share with Andrew Madden that I have some hard news. We talk about his rectal cancer and metastatic burden to liver and lung.  With permission, I share prognosis. Family at bedside encourages Andrew Madden to have surgery stating that he may have a year or 2. I share with everyone that we must be truthful, and that although only God knows, we feel that he has likely 3 to 4 months. I share my worry over recuperation from surgery. I share my worry that [name] will continue to have functional decline, increased pain, decreased nutritional state, and the sleep most of the day as time progresses. Andrew Madden states that he does not see the need to have surgery if he only has 3 months to live. I encourage him to think and pray about his choices. I share that I will return tomorrow to answer more questions. Andrew Madden states that he feels that his father will not take surgery, but Andrew Madden Andrew states that he will have surgery. Andrew Madden asks me for my opinion, what would I do. I share that only Andrew Madden can decide. That although it would be appropriate to say no surgery focus on comfort and dignity, we realize that every day is important.  Healthcare power of attorney NEXT OF KIN - Andrew Madden states that he will wishes for his Andrew, Curly Madden, and Andrew Madden to make health care decisions if he is unable.   SUMMARY OF RECOMMENDATIONS   continue to treat the treatable but no extraordinary measures such as CPR or intubation. [name] and family continue to consider the option of no surgery, home with hospice.  Code Status/Advance Care Planning:  DNR  Symptom Management:   per Dr. Luan Pulling, no additional needs at this time.  Palliative Prophylaxis:   Bowel Regimen and Frequent Pain Assessment  Additional Recommendations (Limitations, Scope, Preferences):  [name] and  family are still considering the option for surgery. At this point surgery is scheduled for Thursday.  Psycho-social/Spiritual:   Desire for further Chaplaincy support:yes  Additional Recommendations: Caregiving  Support/Resources and Education on Hospice  Prognosis:   < 3 months, would not be surprising based on functional decline over the last 3 months, weight loss of 30 to 40 pounds over the last 3 months, metastatic cancer burden. If Mr. Faulk  decides to not have diverting colostomy, his prognosis could be reduced to weeks.  Discharge Planning: To Be Determined      Primary Diagnoses: Present on Admission: . Rectal mass . Rectal pain . Hyperlipidemia . Hypertension . AKI (acute kidney injury) ([place])   I have reviewed the medical record, interviewed the patient and family, and examined the patient. The following aspects are pertinent.  Past Medical History:  Diagnosis Date  . Diabetes mellitus without complication ([place])   . Hyperlipidemia   . Hypertension   . Myocardial infarct [facility])    Social History   Social History  . Marital status: Married    Spouse name: N/A  . Number of children: N/A  . Years of education: N/A   Social History Main Topics  . Smoking status: Former Research scientist (life sciences)  . Smokeless tobacco: Never Used  . Alcohol use No  . Drug use: No  . Sexual activity: Not Asked   Other Topics Concern  . None   Social History Narrative  . None   Family History  Problem Relation Age of Onset  . Colon cancer Neg Hx    Scheduled Meds: . feeding supplement  1 Container Oral TID BM  . feeding supplement (PRO-STAT SUGAR FREE 64)  30 mL Oral TID  . hydrocortisone  1 application Topical BID  . insulin aspart  0-9 Units Subcutaneous TID WC  . metoprolol tartrate  25 mg Oral BID  . simvastatin  20 mg Oral QHS   Continuous Infusions: PRN Meds:.acetaminophen, ALPRAZolam, ALPRAZolam, HYDROcodone-acetaminophen, lidocaine, ondansetron **OR** ondansetron (ZOFRAN)  IV Medications Prior to Admission:  Prior to Admission medications   Medication Sig Start Date End Date Taking? Authorizing Provider  acetaminophen (TYLENOL) 325 MG tablet Take 650 mg by mouth every 6 (six) hours as needed for mild pain, fever or headache.   Yes [provider]  aspirin 325 MG tablet Take 650 mg by mouth daily.    Yes [provider]  hydrocortisone 2.5 % cream Apply 1 application topically 2 (two) times daily. 05/23/17  Yes [provider]  losartan-hydrochlorothiazide (HYZAAR) 100-12.5 MG tablet Take 1 tablet by mouth daily. 03/17/17  Yes [provider]  metFORMIN (GLUMETZA) 500 MG (MOD) 24 hr tablet Take 500 mg by mouth daily with breakfast.   Yes [provider]  metoprolol tartrate (LOPRESSOR) 25 MG tablet Take 25 mg by mouth 2 (two) times daily.   Yes [provider]  PROCTOZONE-HC 2.5 % rectal cream Apply 1 application topically 2 (two) times daily. 05/03/17  Yes [provider]  simvastatin (ZOCOR) 40 MG tablet Take 20 mg by mouth at bedtime.    Yes [provider]  TRIPLE ANTIBIOTIC PLUS MAX ST 1 % OINT Apply 1 application topically 2 (two) times daily. 03/20/17  Yes [provider]   No Known Allergies Review of Systems  Unable to perform ROS: Other    Physical Exam  Constitutional: He is oriented to person, place, and time. No distress.  Makes and keeps eye contact.    HENT:  Very hard of hearing.   Cardiovascular: Normal rate and regular rhythm.   Pulmonary/Chest: Effort normal. No respiratory distress.  Abdominal: Soft. He exhibits no distension.  Musculoskeletal: He exhibits no edema.  Some muscle wasting.   Neurological: He is oriented to person, place, and time.  Skin: Skin is warm and dry.  Nursing note and vitals reviewed.   Vital Signs: BP 139/66 (BP Location: Right Arm)   Pulse 72  Temp 98.2 F (36.8 C) (Oral)   Resp 18   Ht 5\' 7"  (1.702 m)   Wt 70.2 kg (154 lb  12.7 oz)   SpO2 100%   BMI 24.24 kg/m  Pain Assessment: 0-10   Pain Score: Asleep   SpO2: SpO2: 100 % O2 Device:SpO2: 100 % O2 Flow Rate: .   IO: Intake/output summary:  Intake/Output Summary (Last 24 hours) at 06/06/17 1306 Last data filed at 06/05/17 1700  Gross per 24 hour  Intake              360 ml  Output                0 ml  Net              360 ml    LBM: Last BM Date: 06/05/17 Baseline Weight: Weight: 70.3 kg (155 lb) Most recent weight: Weight: 70.2 kg (154 lb 12.7 oz)     Palliative Assessment/Data:   Flowsheet Rows     Most Recent Value  Intake Tab  Referral Department  Hospitalist  Unit at Time of Referral  Med/Surg Unit  Palliative Care Primary Diagnosis  Cancer  Date Notified  06/06/17  Palliative Care Type  New Palliative care  Reason for referral  Clarify Goals of Care  Date of Admission  06/04/17  Date first seen by Palliative Care  06/06/17  # of days Palliative referral response time  0 Day(s)  # of days IP prior to Palliative referral  2  Clinical Assessment  Palliative Performance Scale Score  40%  Pain Max last 24 hours  Not able to report  Pain Min Last 24 hours  Not able to report  Dyspnea Max Last 24 Hours  Not able to report  Dyspnea Min Last 24 hours  Not able to report  Psychosocial & Spiritual Assessment  Palliative Care Outcomes  Patient/Family meeting held?  Yes  Who was at the meeting?  patient, Andrew and Andrew Copy  Palliative Care Outcomes  Provided psychosocial or spiritual support, Provided end of life care assistance, Clarified goals of care  Patient/Family wishes: Interventions discontinued/not started   Mechanical Ventilation      Time In:    1030 and 1500 Time Out: 1120 and 1540 Time Total:   50 + 40 = 90 minutes total  Greater than 50%  of this time was spent counseling and coordinating care related to the above assessment and plan.  Signed by: Drue Novel, NP   Please contact Palliative Medicine Team phone  at [phone] for questions and concerns.  For individual provider: See Shea Evans

## 2017-06-06 NOTE — Consult Note (Signed)
Reason for Consult: Rectal mass Referring Physician: Dr. Velvet Bathe  Andrew Madden is an 83 y.o. male.  HPI: Patient is an 83 year old white male with multiple medical problems who presented to the emergency room with a two-month history of worsening rectal pain. Patient was in the hospital at another facility and was told to follow-up with GI as an outpatient. He presented [facility] emergency room due to worsening rectal pain. CT scan of the abdomen and pelvis revealed a rectal mass with probable metastatic disease to the liver and lungs. The patient underwent a flexible sigmoidoscopy by Dr. Gala Romney yesterday which revealed a near obstructing rectal mass. It is palpable on digital examination. He and the family are aware of the results, though the patient does have some denial. He does not want extensive treatment for this. 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 A preoperative CEA level is greater than 500.  Past Medical History:  Diagnosis Date  . Diabetes mellitus without complication ([place])   . Hyperlipidemia   . Hypertension   . Myocardial infarct [facility])     Past Surgical History:  Procedure Laterality Date  . None      Family History  Problem Relation Age of Onset  . Colon cancer Neg Hx     Social History:  reports that he has quit smoking. He has never used smokeless tobacco. He reports that he does not drink alcohol or use drugs.  Allergies: No Known Allergies  Medications:  Prior to Admission:  Prescriptions Prior to Admission  Medication Sig Dispense Refill Last Dose  . acetaminophen (TYLENOL) 325 MG tablet Take 650 mg by mouth every 6 (six) hours as needed for mild pain, fever or headache.   06/04/2017 at 0930  . aspirin 325 MG tablet Take 650 mg by mouth daily.    06/03/2017 at 1300  . hydrocortisone 2.5 % cream Apply 1 application topically 2 (two) times daily.   06/03/2017 at Unknown time  . losartan-hydrochlorothiazide (HYZAAR) 100-12.5 MG  tablet Take 1 tablet by mouth daily.   06/03/2017 at 1300  . metFORMIN (GLUMETZA) 500 MG (MOD) 24 hr tablet Take 500 mg by mouth daily with breakfast.   06/03/2017 at Unknown time  . metoprolol tartrate (LOPRESSOR) 25 MG tablet Take 25 mg by mouth 2 (two) times daily.   06/03/2017 at 1300  . PROCTOZONE-HC 2.5 % rectal cream Apply 1 application topically 2 (two) times daily.   06/04/2017 at Unknown time  . simvastatin (ZOCOR) 40 MG tablet Take 20 mg by mouth at bedtime.    06/03/2017 at Unknown time  . TRIPLE ANTIBIOTIC PLUS MAX ST 1 % OINT Apply 1 application topically 2 (two) times daily.   06/03/2017 at Unknown time   Scheduled: . feeding supplement  1 Container Oral TID BM  . feeding supplement (PRO-STAT SUGAR FREE 64)  30 mL Oral TID  . hydrocortisone  1 application Topical BID  . insulin aspart  0-9 Units Subcutaneous TID WC  . metoprolol tartrate  25 mg Oral BID  . simvastatin  20 mg Oral QHS    Results for orders placed or performed during the hospital encounter of 06/04/17 (from the past 48 hour(s))  POC CBG, ED     Status: Abnormal   Collection Time: 06/04/17  3:55 PM  Result Value Ref Range   Glucose-Capillary 132 (H) 65 - 99 mg/dL  CEA     Status: Abnormal   Collection Time: 06/04/17  4:43 PM  Result Value Ref Range   CEA 533.5 (H) 0.0 - 4.7 ng/mL    Comment: (NOTE)       Roche ECLIA methodology       Nonsmokers  <3.9                                     Smokers     <5.6 Performed At: [facility] [place], [place] [id number] Lindon Romp MD MG:[id number]   CBC with Differential     Status: Abnormal   Collection Time: 06/04/17  4:44 PM  Result Value Ref Range   WBC 11.1 (H) 4.0 - 10.5 K/uL   RBC 4.37 4.22 - 5.81 MIL/uL   Hemoglobin 12.8 (L) 13.0 - 17.0 g/dL   HCT 38.1 (L) 39.0 - 52.0 %   MCV 87.2 78.0 - 100.0 fL   MCH 29.3 26.0 - 34.0 pg   MCHC 33.6 30.0 - 36.0 g/dL   RDW 14.1 11.5 - 15.5 %   Platelets 203 150 - 400 K/uL   Neutrophils Relative  % 70 %   Neutro Abs 7.7 1.7 - 7.7 K/uL   Lymphocytes Relative 22 %   Lymphs Abs 2.5 0.7 - 4.0 K/uL   Monocytes Relative 7 %   Monocytes Absolute 0.8 0.1 - 1.0 K/uL   Eosinophils Relative 1 %   Eosinophils Absolute 0.1 0.0 - 0.7 K/uL   Basophils Relative 0 %   Basophils Absolute 0.0 0.0 - 0.1 K/uL  Basic metabolic panel     Status: Abnormal   Collection Time: 06/04/17  4:44 PM  Result Value Ref Range   Sodium 145 135 - 145 mmol/L   Potassium 3.9 3.5 - 5.1 mmol/L   Chloride 103 101 - 111 mmol/L   CO2 28 22 - 32 mmol/L   Glucose, Bld 143 (H) 65 - 99 mg/dL   BUN 29 (H) 6 - 20 mg/dL   Creatinine, Ser 1.59 (H) 0.61 - 1.24 mg/dL   Calcium 9.8 8.9 - 10.3 mg/dL   GFR calc non Af Amer 38 (L) >60 mL/min   GFR calc Af Amer 44 (L) >60 mL/min    Comment: (NOTE) The eGFR has been calculated using the CKD EPI equation. This calculation has not been validated in all clinical situations. eGFR's persistently <60 mL/min signify possible Chronic Kidney Disease.    Anion gap 14 5 - 15  Urinalysis, Routine w reflex microscopic     Status: Abnormal   Collection Time: 06/04/17  8:20 PM  Result Value Ref Range   Color, Urine YELLOW YELLOW   APPearance CLEAR CLEAR   Specific Gravity, Urine 1.038 (H) 1.005 - 1.030   pH 6.0 5.0 - 8.0   Glucose, UA NEGATIVE NEGATIVE mg/dL   Hgb urine dipstick NEGATIVE NEGATIVE   Bilirubin Urine NEGATIVE NEGATIVE   Ketones, ur 5 (A) NEGATIVE mg/dL   Protein, ur NEGATIVE NEGATIVE mg/dL   Nitrite NEGATIVE NEGATIVE   Leukocytes, UA NEGATIVE NEGATIVE  Glucose, capillary     Status: Abnormal   Collection Time: 06/04/17 10:54 PM  Result Value Ref Range   Glucose-Capillary 148 (H) 65 - 99 mg/dL  Basic metabolic panel     Status: Abnormal   Collection Time: 06/05/17  5:17 AM  Result Value Ref Range   Sodium 138 135 - 145 mmol/L    Comment: DELTA CHECK NOTED   Potassium 4.0 3.5 - 5.1  mmol/L   Chloride 103 101 - 111 mmol/L   CO2 27 22 - 32 mmol/L   Glucose, Bld 127  (H) 65 - 99 mg/dL   BUN 24 (H) 6 - 20 mg/dL   Creatinine, Ser 1.39 (H) 0.61 - 1.24 mg/dL   Calcium 8.5 (L) 8.9 - 10.3 mg/dL   GFR calc non Af Amer 44 (L) >60 mL/min   GFR calc Af Amer 51 (L) >60 mL/min    Comment: (NOTE) The eGFR has been calculated using the CKD EPI equation. This calculation has not been validated in all clinical situations. eGFR's persistently <60 mL/min signify possible Chronic Kidney Disease.    Anion gap 8 5 - 15  CBC     Status: Abnormal   Collection Time: 06/05/17  5:17 AM  Result Value Ref Range   WBC 9.1 4.0 - 10.5 K/uL   RBC 3.96 (L) 4.22 - 5.81 MIL/uL   Hemoglobin 11.6 (L) 13.0 - 17.0 g/dL   HCT 35.1 (L) 39.0 - 52.0 %   MCV 88.6 78.0 - 100.0 fL   MCH 29.3 26.0 - 34.0 pg   MCHC 33.0 30.0 - 36.0 g/dL   RDW 14.0 11.5 - 15.5 %   Platelets 165 150 - 400 K/uL  Glucose, capillary     Status: Abnormal   Collection Time: 06/05/17  7:29 AM  Result Value Ref Range   Glucose-Capillary 117 (H) 65 - 99 mg/dL  Glucose, capillary     Status: Abnormal   Collection Time: 06/05/17 11:13 AM  Result Value Ref Range   Glucose-Capillary 124 (H) 65 - 99 mg/dL  Glucose, capillary     Status: Abnormal   Collection Time: 06/05/17  4:34 PM  Result Value Ref Range   Glucose-Capillary 127 (H) 65 - 99 mg/dL  Glucose, capillary     Status: Abnormal   Collection Time: 06/05/17  9:11 PM  Result Value Ref Range   Glucose-Capillary 141 (H) 65 - 99 mg/dL   Comment 1 Notify RN    Comment 2 Document in Chart   Glucose, capillary     Status: Abnormal   Collection Time: 06/06/17  8:04 AM  Result Value Ref Range   Glucose-Capillary 136 (H) 65 - 99 mg/dL   Comment 1 Notify RN    Comment 2 Document in Chart     Ct Abdomen Pelvis W Contrast  Result Date: 06/04/2017 CLINICAL DATA:  83 year old male with rectal pain and weight loss. EXAM: CT ABDOMEN AND PELVIS WITH CONTRAST TECHNIQUE: Multidetector CT imaging of the abdomen and pelvis was performed using the standard protocol  following bolus administration of intravenous contrast. CONTRAST:  5m ISOVUE-300 IOPAMIDOL (ISOVUE-300) INJECTION 61% COMPARISON:  None. FINDINGS: Lower chest: There is an 8 mm right middle lobe pulmonary nodule. A 5 mm nodule is also seen in the right middle lobe. Small pulmonary nodules including a 4 mm left lower lobe as well as a 5 mm right lower lobe noted. Nonemergent CT of the chest is recommended for complete evaluation of the lungs. There is coronary vascular calcification primarily involving the LAD, and RCA. There is an area of calcification involving the inferior wall of the left ventricle which may be related to prior infarct. There is no intra-abdominal free air or free fluid. Hepatobiliary: There multiple hepatic hypodense lesions. The largest lesion or confluence of adjacent lesions in the right lobe of the liver measure up to 7.2 x 7.2 cm and most consistent with metastatic disease. There is no intrahepatic  biliary ductal dilatation. The gallbladder is mildly distended. No calcified gallstone. No pericholecystic fluid. Pancreas: Unremarkable. No pancreatic ductal dilatation or surrounding inflammatory changes. Spleen: Normal in size without focal abnormality. Adrenals/Urinary Tract: Mildly thickened left adrenal gland may represent hyperplasia versus thickening. No discrete nodule. The right kidney is atrophic. Multiple right renal hypodense lesions are noted measuring up to 2.7 cm. These lesions demonstrate fluid attenuation most consistent with cysts. Subcentimeter right renal hypodense lesions are too small to characterize but also likely represent cysts. There is cortical irregularity of the left kidney, likely related to prior infarct. There is a 2 cm left renal upper pole cyst. No hydronephrosis. The visualized ureters appear unremarkable. The urinary bladder is mildly distended. There is trabecular appearance of the bladder wall consistent with chronic bladder outlet obstruction. There is  extension of the bladder into the upper right inguinal canal. Stomach/Bowel: There is irregular and thickened appearance of the rectal wall (series 2, image 93 and coronal series 6, image 81). There is sigmoid diverticulosis with muscular hypertrophy. Mild perisigmoid stranding likely represent chronic inflammation and scarring and less likely active inflammation. Moderate amount of stool noted throughout the colon and within the rectosigmoid. There is no evidence of bowel obstruction. Normal appendix. Vascular/Lymphatic: There is advanced aortoiliac atherosclerotic disease. The origins of the celiac axis, SMA, IMA appear patent. The SMV, splenic vein, and main portal vein are patent. The IVC appears unremarkable. No portal venous gas identified. There is no adenopathy. Reproductive: The prostate gland is enlarged and heterogeneous measuring up to 6 cm in transverse axial diameter. Other: There are small left and moderate size right inguinal hernia containing fat. Musculoskeletal: There is degenerative changes of the spine. No acute fracture. IMPRESSION: 1. Thickened and irregular rectal wall concerning for a rectal mass. Correlation with clinical exam and sigmoidoscopy or colonoscopy recommended. There may be associated mass effect and luminal narrowing of the rectum with moderate amount of residual stool burden throughout the colon. 2. Sigmoid diverticulosis with evidence of chronic inflammation. No definite active inflammatory changes. 3. No bowel obstruction.  Normal appendix. 4. Hepatic hypodense lesions most consistent with metastatic disease. 5. Multiple pulmonary nodules as described concerning for metastatic disease. Nonemergent CT of the chest is recommended for complete evaluation of the lungs. 6.  Aortic Atherosclerosis (ICD10-I70.0). 7. Coronary vascular calcification. 8. Right renal atrophy. Electronically Signed   By: Anner Crete M.D.   On: 06/04/2017 19:25    ROS:  Review of systems not  obtained due to patient factors.  Blood pressure 139/66, pulse 72, temperature 98.2 F (36.8 C), temperature source Oral, resp. rate 18, height 5' 7"  (1.702 m), weight 154 lb 12.7 oz (70.2 kg), SpO2 100 %. Physical Exam: Loud white male in no acute distress Head is normocephalic, atraumatic Neck is supple without carotid bruits Lungs clear auscultation with breath sounds bilaterally Heart examination reveals a regular rate and rhythm without S3, S4, murmurs Abdomen is rotund but not particularly tense. No abdominal pain noted. No hernias are appreciated. I could not appreciate hepatosplenomegaly. CT scan images personally reviewed Discussed flexible sigmoidoscopy results with Dr. Gala Romney  Assessment/Plan: Impression: Near obstructing rectal carcinoma with metastatic disease Plan: The only thing I can offer at this point is palliative transverse loop colostomy due to the near obstructing nature of the rectal cancer. The patient and family are in agreement in proceeding with the procedure. I have scheduled this for Thursday. We will give him a mild bowel prep. Agree with palliative care consultation.  Andrew Madden 06/06/2017, 8:43 AM

## 2017-06-06 NOTE — Progress Notes (Signed)
Subjective: No abdominal pain, N/V. Rectal discomfort noted  Objective: Vital signs in last 24 hours: Temp:  [97.6 F (36.4 C)-98.6 F (37 C)] 98.2 F (36.8 C) (06/19 0602) Pulse Rate:  [65-72] 72 (06/19 0602) Resp:  [18-22] 18 (06/19 0602) BP: (137-145)/(59-80) 139/66 (06/19 0602) SpO2:  [93 %-100 %] 100 % (06/19 0602) Last BM Date: 06/05/17 General:   Alert and oriented, resting but easily awakens.  Head:  Normocephalic and atraumatic. Abdomen:  Bowel sounds present, full but soft, non-tender, non-distended.  Extremities:  Without edema. Neurologic:  Alert and  oriented x4  Intake/Output from previous day: 06/18 0701 - 06/19 0700 In: 360 [P.O.:360] Out: -  Intake/Output this shift: No intake/output data recorded.  Lab Results:  Recent Labs  06/04/17 1644 06/05/17 0517  WBC 11.1* 9.1  HGB 12.8* 11.6*  HCT 38.1* 35.1*  PLT 203 165   BMET  Recent Labs  06/04/17 1644 06/05/17 0517  NA 145 138  K 3.9 4.0  CL 103 103  CO2 28 27  GLUCOSE 143* 127*  BUN 29* 24*  CREATININE 1.59* 1.39*  CALCIUM 9.8 8.5*   Studies/Results: Ct Abdomen Pelvis W Contrast  Result Date: 06/04/2017 CLINICAL DATA:  83 year old male with rectal pain and weight loss. EXAM: CT ABDOMEN AND PELVIS WITH CONTRAST TECHNIQUE: Multidetector CT imaging of the abdomen and pelvis was performed using the standard protocol following bolus administration of intravenous contrast. CONTRAST:  87mL ISOVUE-300 IOPAMIDOL (ISOVUE-300) INJECTION 61% COMPARISON:  None. FINDINGS: Lower chest: There is an 8 mm right middle lobe pulmonary nodule. A 5 mm nodule is also seen in the right middle lobe. Small pulmonary nodules including a 4 mm left lower lobe as well as a 5 mm right lower lobe noted. Nonemergent CT of the chest is recommended for complete evaluation of the lungs. There is coronary vascular calcification primarily involving the LAD, and RCA. There is an area of calcification involving the inferior wall  of the left ventricle which may be related to prior infarct. There is no intra-abdominal free air or free fluid. Hepatobiliary: There multiple hepatic hypodense lesions. The largest lesion or confluence of adjacent lesions in the right lobe of the liver measure up to 7.2 x 7.2 cm and most consistent with metastatic disease. There is no intrahepatic biliary ductal dilatation. The gallbladder is mildly distended. No calcified gallstone. No pericholecystic fluid. Pancreas: Unremarkable. No pancreatic ductal dilatation or surrounding inflammatory changes. Spleen: Normal in size without focal abnormality. Adrenals/Urinary Tract: Mildly thickened left adrenal gland may represent hyperplasia versus thickening. No discrete nodule. The right kidney is atrophic. Multiple right renal hypodense lesions are noted measuring up to 2.7 cm. These lesions demonstrate fluid attenuation most consistent with cysts. Subcentimeter right renal hypodense lesions are too small to characterize but also likely represent cysts. There is cortical irregularity of the left kidney, likely related to prior infarct. There is a 2 cm left renal upper pole cyst. No hydronephrosis. The visualized ureters appear unremarkable. The urinary bladder is mildly distended. There is trabecular appearance of the bladder wall consistent with chronic bladder outlet obstruction. There is extension of the bladder into the upper right inguinal canal. Stomach/Bowel: There is irregular and thickened appearance of the rectal wall (series 2, image 93 and coronal series 6, image 81). There is sigmoid diverticulosis with muscular hypertrophy. Mild perisigmoid stranding likely represent chronic inflammation and scarring and less likely active inflammation. Moderate amount of stool noted throughout the colon and within the rectosigmoid. There is  no evidence of bowel obstruction. Normal appendix. Vascular/Lymphatic: There is advanced aortoiliac atherosclerotic disease. The  origins of the celiac axis, SMA, IMA appear patent. The SMV, splenic vein, and main portal vein are patent. The IVC appears unremarkable. No portal venous gas identified. There is no adenopathy. Reproductive: The prostate gland is enlarged and heterogeneous measuring up to 6 cm in transverse axial diameter. Other: There are small left and moderate size right inguinal hernia containing fat. Musculoskeletal: There is degenerative changes of the spine. No acute fracture. IMPRESSION: 1. Thickened and irregular rectal wall concerning for a rectal mass. Correlation with clinical exam and sigmoidoscopy or colonoscopy recommended. There may be associated mass effect and luminal narrowing of the rectum with moderate amount of residual stool burden throughout the colon. 2. Sigmoid diverticulosis with evidence of chronic inflammation. No definite active inflammatory changes. 3. No bowel obstruction.  Normal appendix. 4. Hepatic hypodense lesions most consistent with metastatic disease. 5. Multiple pulmonary nodules as described concerning for metastatic disease. Nonemergent CT of the chest is recommended for complete evaluation of the lungs. 6.  Aortic Atherosclerosis (ICD10-I70.0). 7. Coronary vascular calcification. 8. Right renal atrophy. Electronically Signed   By: Anner Crete M.D.   On: 06/04/2017 19:25    Assessment: 83 year old male with nearly obstructing rectal tumor s/p biopsy on 6/18, with evidence of likely metastatic disease on imaging. Surgery on board with plans for diverting colostomy in near future. CEA markedly elevated at 533. Palliative care has been requested by attending to assist with decision-making. Son is unsure that patient would want to undergo any treatment.   Plan: Surgery on board Palliative care requested per attending Will follow-up on pathology as it comes available  Will sign off for now   Annitta Needs, PhD, ANP-BC [facility]     LOS: 1 day     06/06/2017, 8:07 AM

## 2017-06-06 NOTE — Progress Notes (Signed)
Subjective: He says he feels okay. The lidocaine jelly is working well for his rectal discomfort. Trazodone did not work for sleep. Discussed with the patient and his son at bedside. He is scheduled for diverting colostomy on Thursday I believe. His rectal mass is near obstructing now. His son says that he does not believe that his father would undergo any sort of treatment including radiation or chemotherapy. Discussed briefly with the patient and he is currently in denial.  Objective: Vital signs in last 24 hours: Temp:  [97.6 F (36.4 C)-98.6 F (37 C)] 98.2 F (36.8 C) (06/19 0602) Pulse Rate:  [65-72] 72 (06/19 0602) Resp:  [18-22] 18 (06/19 0602) BP: (137-145)/(59-80) 139/66 (06/19 0602) SpO2:  [93 %-100 %] 100 % (06/19 0602) Weight change:  Last BM Date: 06/05/17  Intake/Output from previous day: 06/18 0701 - 06/19 0700 In: 360 [P.O.:360] Out: -   PHYSICAL EXAM General appearance: alert and no distress Resp: clear to auscultation bilaterally Cardio: regular rate and rhythm, S1, S2 normal, no murmur, click, rub or gallop GI: soft, non-tender; bowel sounds normal; no masses,  no organomegaly Extremities: extremities normal, atraumatic, no cyanosis or edema Skin warm and dry  Lab Results:  Results for orders placed or performed during the hospital encounter of 06/04/17 (from the past 48 hour(s))  POC CBG, ED     Status: Abnormal   Collection Time: 06/04/17  3:55 PM  Result Value Ref Range   Glucose-Capillary 132 (H) 65 - 99 mg/dL  CEA     Status: Abnormal   Collection Time: 06/04/17  4:43 PM  Result Value Ref Range   CEA 533.5 (H) 0.0 - 4.7 ng/mL    Comment: (NOTE)       Roche ECLIA methodology       Nonsmokers  <3.9                                     Smokers     <5.6 Performed At: [facility] [place], [place] [id number] Lindon Romp MD MB:[id number]   CBC with Differential     Status: Abnormal   Collection Time: 06/04/17  4:44 PM   Result Value Ref Range   WBC 11.1 (H) 4.0 - 10.5 K/uL   RBC 4.37 4.22 - 5.81 MIL/uL   Hemoglobin 12.8 (L) 13.0 - 17.0 g/dL   HCT 38.1 (L) 39.0 - 52.0 %   MCV 87.2 78.0 - 100.0 fL   MCH 29.3 26.0 - 34.0 pg   MCHC 33.6 30.0 - 36.0 g/dL   RDW 14.1 11.5 - 15.5 %   Platelets 203 150 - 400 K/uL   Neutrophils Relative % 70 %   Neutro Abs 7.7 1.7 - 7.7 K/uL   Lymphocytes Relative 22 %   Lymphs Abs 2.5 0.7 - 4.0 K/uL   Monocytes Relative 7 %   Monocytes Absolute 0.8 0.1 - 1.0 K/uL   Eosinophils Relative 1 %   Eosinophils Absolute 0.1 0.0 - 0.7 K/uL   Basophils Relative 0 %   Basophils Absolute 0.0 0.0 - 0.1 K/uL  Basic metabolic panel     Status: Abnormal   Collection Time: 06/04/17  4:44 PM  Result Value Ref Range   Sodium 145 135 - 145 mmol/L   Potassium 3.9 3.5 - 5.1 mmol/L   Chloride 103 101 - 111 mmol/L   CO2 28 22 - 32 mmol/L  Glucose, Bld 143 (H) 65 - 99 mg/dL   BUN 29 (H) 6 - 20 mg/dL   Creatinine, Ser 1.59 (H) 0.61 - 1.24 mg/dL   Calcium 9.8 8.9 - 10.3 mg/dL   GFR calc non Af Amer 38 (L) >60 mL/min   GFR calc Af Amer 44 (L) >60 mL/min    Comment: (NOTE) The eGFR has been calculated using the CKD EPI equation. This calculation has not been validated in all clinical situations. eGFR's persistently <60 mL/min signify possible Chronic Kidney Disease.    Anion gap 14 5 - 15  Urinalysis, Routine w reflex microscopic     Status: Abnormal   Collection Time: 06/04/17  8:20 PM  Result Value Ref Range   Color, Urine YELLOW YELLOW   APPearance CLEAR CLEAR   Specific Gravity, Urine 1.038 (H) 1.005 - 1.030   pH 6.0 5.0 - 8.0   Glucose, UA NEGATIVE NEGATIVE mg/dL   Hgb urine dipstick NEGATIVE NEGATIVE   Bilirubin Urine NEGATIVE NEGATIVE   Ketones, ur 5 (A) NEGATIVE mg/dL   Protein, ur NEGATIVE NEGATIVE mg/dL   Nitrite NEGATIVE NEGATIVE   Leukocytes, UA NEGATIVE NEGATIVE  Glucose, capillary     Status: Abnormal   Collection Time: 06/04/17 10:54 PM  Result Value Ref Range    Glucose-Capillary 148 (H) 65 - 99 mg/dL  Basic metabolic panel     Status: Abnormal   Collection Time: 06/05/17  5:17 AM  Result Value Ref Range   Sodium 138 135 - 145 mmol/L    Comment: DELTA CHECK NOTED   Potassium 4.0 3.5 - 5.1 mmol/L   Chloride 103 101 - 111 mmol/L   CO2 27 22 - 32 mmol/L   Glucose, Bld 127 (H) 65 - 99 mg/dL   BUN 24 (H) 6 - 20 mg/dL   Creatinine, Ser 1.39 (H) 0.61 - 1.24 mg/dL   Calcium 8.5 (L) 8.9 - 10.3 mg/dL   GFR calc non Af Amer 44 (L) >60 mL/min   GFR calc Af Amer 51 (L) >60 mL/min    Comment: (NOTE) The eGFR has been calculated using the CKD EPI equation. This calculation has not been validated in all clinical situations. eGFR's persistently <60 mL/min signify possible Chronic Kidney Disease.    Anion gap 8 5 - 15  CBC     Status: Abnormal   Collection Time: 06/05/17  5:17 AM  Result Value Ref Range   WBC 9.1 4.0 - 10.5 K/uL   RBC 3.96 (L) 4.22 - 5.81 MIL/uL   Hemoglobin 11.6 (L) 13.0 - 17.0 g/dL   HCT 35.1 (L) 39.0 - 52.0 %   MCV 88.6 78.0 - 100.0 fL   MCH 29.3 26.0 - 34.0 pg   MCHC 33.0 30.0 - 36.0 g/dL   RDW 14.0 11.5 - 15.5 %   Platelets 165 150 - 400 K/uL  Glucose, capillary     Status: Abnormal   Collection Time: 06/05/17  7:29 AM  Result Value Ref Range   Glucose-Capillary 117 (H) 65 - 99 mg/dL  Glucose, capillary     Status: Abnormal   Collection Time: 06/05/17 11:13 AM  Result Value Ref Range   Glucose-Capillary 124 (H) 65 - 99 mg/dL  Glucose, capillary     Status: Abnormal   Collection Time: 06/05/17  4:34 PM  Result Value Ref Range   Glucose-Capillary 127 (H) 65 - 99 mg/dL  Glucose, capillary     Status: Abnormal   Collection Time: 06/05/17  9:11 PM  Result  Value Ref Range   Glucose-Capillary 141 (H) 65 - 99 mg/dL   Comment 1 Notify RN    Comment 2 Document in Chart   Glucose, capillary     Status: Abnormal   Collection Time: 06/06/17  8:04 AM  Result Value Ref Range   Glucose-Capillary 136 (H) 65 - 99 mg/dL   Comment 1  Notify RN    Comment 2 Document in Chart     ABGS No results for input(s): PHART, PO2ART, TCO2, HCO3 in the last 72 hours.  Invalid input(s): PCO2 CULTURES No results found for this or any previous visit (from the past 240 hour(s)). Studies/Results: Ct Abdomen Pelvis W Contrast  Result Date: 06/04/2017 CLINICAL DATA:  83 year old male with rectal pain and weight loss. EXAM: CT ABDOMEN AND PELVIS WITH CONTRAST TECHNIQUE: Multidetector CT imaging of the abdomen and pelvis was performed using the standard protocol following bolus administration of intravenous contrast. CONTRAST:  78m ISOVUE-300 IOPAMIDOL (ISOVUE-300) INJECTION 61% COMPARISON:  None. FINDINGS: Lower chest: There is an 8 mm right middle lobe pulmonary nodule. A 5 mm nodule is also seen in the right middle lobe. Small pulmonary nodules including a 4 mm left lower lobe as well as a 5 mm right lower lobe noted. Nonemergent CT of the chest is recommended for complete evaluation of the lungs. There is coronary vascular calcification primarily involving the LAD, and RCA. There is an area of calcification involving the inferior wall of the left ventricle which may be related to prior infarct. There is no intra-abdominal free air or free fluid. Hepatobiliary: There multiple hepatic hypodense lesions. The largest lesion or confluence of adjacent lesions in the right lobe of the liver measure up to 7.2 x 7.2 cm and most consistent with metastatic disease. There is no intrahepatic biliary ductal dilatation. The gallbladder is mildly distended. No calcified gallstone. No pericholecystic fluid. Pancreas: Unremarkable. No pancreatic ductal dilatation or surrounding inflammatory changes. Spleen: Normal in size without focal abnormality. Adrenals/Urinary Tract: Mildly thickened left adrenal gland may represent hyperplasia versus thickening. No discrete nodule. The right kidney is atrophic. Multiple right renal hypodense lesions are noted measuring up to  2.7 cm. These lesions demonstrate fluid attenuation most consistent with cysts. Subcentimeter right renal hypodense lesions are too small to characterize but also likely represent cysts. There is cortical irregularity of the left kidney, likely related to prior infarct. There is a 2 cm left renal upper pole cyst. No hydronephrosis. The visualized ureters appear unremarkable. The urinary bladder is mildly distended. There is trabecular appearance of the bladder wall consistent with chronic bladder outlet obstruction. There is extension of the bladder into the upper right inguinal canal. Stomach/Bowel: There is irregular and thickened appearance of the rectal wall (series 2, image 93 and coronal series 6, image 81). There is sigmoid diverticulosis with muscular hypertrophy. Mild perisigmoid stranding likely represent chronic inflammation and scarring and less likely active inflammation. Moderate amount of stool noted throughout the colon and within the rectosigmoid. There is no evidence of bowel obstruction. Normal appendix. Vascular/Lymphatic: There is advanced aortoiliac atherosclerotic disease. The origins of the celiac axis, SMA, IMA appear patent. The SMV, splenic vein, and main portal vein are patent. The IVC appears unremarkable. No portal venous gas identified. There is no adenopathy. Reproductive: The prostate gland is enlarged and heterogeneous measuring up to 6 cm in transverse axial diameter. Other: There are small left and moderate size right inguinal hernia containing fat. Musculoskeletal: There is degenerative changes of the spine. No acute  fracture. IMPRESSION: 1. Thickened and irregular rectal wall concerning for a rectal mass. Correlation with clinical exam and sigmoidoscopy or colonoscopy recommended. There may be associated mass effect and luminal narrowing of the rectum with moderate amount of residual stool burden throughout the colon. 2. Sigmoid diverticulosis with evidence of chronic  inflammation. No definite active inflammatory changes. 3. No bowel obstruction.  Normal appendix. 4. Hepatic hypodense lesions most consistent with metastatic disease. 5. Multiple pulmonary nodules as described concerning for metastatic disease. Nonemergent CT of the chest is recommended for complete evaluation of the lungs. 6.  Aortic Atherosclerosis (ICD10-I70.0). 7. Coronary vascular calcification. 8. Right renal atrophy. Electronically Signed   By: Anner Crete M.D.   On: 06/04/2017 19:25    Medications:  Prior to Admission:  Prescriptions Prior to Admission  Medication Sig Dispense Refill Last Dose  . acetaminophen (TYLENOL) 325 MG tablet Take 650 mg by mouth every 6 (six) hours as needed for mild pain, fever or headache.   06/04/2017 at 0930  . aspirin 325 MG tablet Take 650 mg by mouth daily.    06/03/2017 at 1300  . hydrocortisone 2.5 % cream Apply 1 application topically 2 (two) times daily.   06/03/2017 at Unknown time  . losartan-hydrochlorothiazide (HYZAAR) 100-12.5 MG tablet Take 1 tablet by mouth daily.   06/03/2017 at 1300  . metFORMIN (GLUMETZA) 500 MG (MOD) 24 hr tablet Take 500 mg by mouth daily with breakfast.   06/03/2017 at Unknown time  . metoprolol tartrate (LOPRESSOR) 25 MG tablet Take 25 mg by mouth 2 (two) times daily.   06/03/2017 at 1300  . PROCTOZONE-HC 2.5 % rectal cream Apply 1 application topically 2 (two) times daily.   06/04/2017 at Unknown time  . simvastatin (ZOCOR) 40 MG tablet Take 20 mg by mouth at bedtime.    06/03/2017 at Unknown time  . TRIPLE ANTIBIOTIC PLUS MAX ST 1 % OINT Apply 1 application topically 2 (two) times daily.   06/03/2017 at Unknown time   Scheduled: . feeding supplement  1 Container Oral TID BM  . feeding supplement (PRO-STAT SUGAR FREE 64)  30 mL Oral TID  . hydrocortisone  1 application Topical BID  . insulin aspart  0-9 Units Subcutaneous TID WC  . metoprolol tartrate  25 mg Oral BID  . simvastatin  20 mg Oral QHS    Continuous:  RCB:ULAGTXMIWOEHO, ALPRAZolam, ALPRAZolam, HYDROcodone-acetaminophen, lidocaine, ondansetron **OR** ondansetron (ZOFRAN) IV  Assesment: He has a rectal mass which appears to be a cancer. Biopsies have been done. He is near obstructing now so he's going to need a diverting colostomy and that is planned. I'm going to go ahead and ask palliative care to be involved to help with decision-making. He's been anxious so I'm going to add Xanax and see if that will help him sleep as well. Continue all his other treatments. Principal Problem:   Rectal mass Active Problems:   Rectal pain   Hypertension   Hyperlipidemia   Diabetes mellitus without complication (HCC)   AKI (acute kidney injury) ([place])    Plan: Probable colostomy later this week. Continue lidocaine jelly. Discontinue trazodone. Start Xanax. Palliative care consultation.    LOS: 1 day   Moroni Nester L 06/06/2017, 8:38 AM

## 2017-06-07 LAB — GLUCOSE, CAPILLARY
GLUCOSE-CAPILLARY: 190 mg/dL — AB (ref 65–99)
Glucose-Capillary: 137 mg/dL — ABNORMAL HIGH (ref 65–99)
Glucose-Capillary: 109 mg/dL — ABNORMAL HIGH (ref 65–99)
Glucose-Capillary: 128 mg/dL — ABNORMAL HIGH (ref 65–99)

## 2017-06-07 LAB — SURGICAL PCR SCREEN
MRSA, PCR: NEGATIVE
STAPHYLOCOCCUS AUREUS: POSITIVE — AB

## 2017-06-07 MED ORDER — MUPIROCIN 2 % EX OINT
1.0000 "application " | TOPICAL_OINTMENT | Freq: Two times a day (BID) | CUTANEOUS | Status: DC
  Administered 2017-06-07 – 2017-06-09 (×2): 1 via NASAL
  Filled 2017-06-07 (×2): qty 22

## 2017-06-07 MED ORDER — PEG 3350-KCL-NA BICARB-NACL 420 G PO SOLR
4000.0000 mL | Freq: Once | ORAL | Status: AC
  Administered 2017-06-07: 4000 mL via ORAL
  Filled 2017-06-07: qty 4000

## 2017-06-07 MED ORDER — CHLORHEXIDINE GLUCONATE CLOTH 2 % EX PADS
6.0000 | MEDICATED_PAD | Freq: Every day | CUTANEOUS | Status: DC
  Administered 2017-06-09: 6 via TOPICAL

## 2017-06-07 MED ORDER — CHLORHEXIDINE GLUCONATE CLOTH 2 % EX PADS: 6.0000 | MEDICATED_PAD | Freq: Once | CUTANEOUS | Status: DC

## 2017-06-07 MED ORDER — CHLORHEXIDINE GLUCONATE CLOTH 2 % EX PADS
6.0000 | MEDICATED_PAD | Freq: Once | CUTANEOUS | Status: AC
  Administered 2017-06-07: 6 via TOPICAL

## 2017-06-07 MED ORDER — CHLORHEXIDINE GLUCONATE CLOTH 2 % EX PADS
6.0000 | MEDICATED_PAD | Freq: Once | CUTANEOUS | Status: AC
  Administered 2017-06-08: 6 via TOPICAL

## 2017-06-07 MED ORDER — ALVIMOPAN 12 MG PO CAPS
12.0000 mg | ORAL_CAPSULE | ORAL | Status: AC
  Administered 2017-06-08: 12 mg via ORAL
  Filled 2017-06-07: qty 1

## 2017-06-07 MED ORDER — LACTATED RINGERS IV SOLN
INTRAVENOUS | Status: DC
  Administered 2017-06-07 – 2017-06-09 (×3): via INTRAVENOUS

## 2017-06-07 MED ORDER — SODIUM CHLORIDE 0.9 % IV SOLN
1.0000 g | INTRAVENOUS | Status: DC
  Filled 2017-06-07: qty 1

## 2017-06-07 MED ORDER — ENOXAPARIN SODIUM 40 MG/0.4ML ~~LOC~~ SOLN
40.0000 mg | SUBCUTANEOUS | Status: DC
  Administered 2017-06-07 – 2017-06-09 (×2): 40 mg via SUBCUTANEOUS
  Filled 2017-06-07 (×2): qty 0.4

## 2017-06-07 NOTE — Progress Notes (Signed)
Daily Progress Note   Patient Name: Andrew Madden       Date: 06/07/2017 DOB: 05-Oct-1929  Age: 83 y.o. MRN#: [id number] Attending Physician: Sinda Du, MD Primary Care Physician: Sinda Du, MD Admit Date: 06/04/2017  Reason for Consultation/Follow-up: Establishing goals of care and Psychosocial/spiritual support  Subjective: Andrew Madden is resting quietly in bed. He greets me making and keeping eye contact. He denies pain or concerns at this time, stating that the cream for his bottom has helped him. Andrew Madden and Andrew Madden are at bedside at this time.  Andrew Madden states that he has no questions at this time. He does however at times questions out loud his decision to have surgery. The goal is for him to be able to eat and drink.  Andrew Madden states that he is sorry that he has spoken so harshly. I share that we all understand what is going on and that we are all supporting him. We talk about postop day, Andrew Madden will likely sleep most of the day. I share that 2nd and 3rd day after surgery may be difficult for him. Family states they understand, Hetty Madden states that it was that way for his father Andrew Madden) after open-heart surgery.   Length of Stay: 2  Current Medications: Scheduled Meds:  . alvimopan  12 mg Oral On Call to OR  . Chlorhexidine Gluconate Cloth  6 each Topical Once   And  . Chlorhexidine Gluconate Cloth  6 each Topical Once  . Chlorhexidine Gluconate Cloth  6 each Topical Once   And  . Chlorhexidine Gluconate Cloth  6 each Topical Once  . enoxaparin (LOVENOX) injection  40 mg Subcutaneous Q24H  . feeding supplement  1 Container Oral TID BM  . feeding supplement (PRO-STAT SUGAR FREE 64)  30 mL Oral TID  . hydrocortisone  1 application Topical BID  . insulin  aspart  0-9 Units Subcutaneous TID WC  . metoprolol tartrate  25 mg Oral BID  . simvastatin  20 mg Oral QHS    Continuous Infusions: . ertapenem    . lactated ringers 50 mL/hr at 06/07/17 1018    PRN Meds: acetaminophen, ALPRAZolam, ALPRAZolam, HYDROcodone-acetaminophen, lidocaine, ondansetron **OR** ondansetron (ZOFRAN) IV  Physical Exam  Constitutional: He is oriented to person, place, and time. No distress.  Appears frail,  makes and keeps eye contact  HENT:  Head: Atraumatic.  Cardiovascular: Normal rate and regular rhythm.   Pulmonary/Chest: Effort normal. No respiratory distress.  Abdominal: Soft. He exhibits no distension. There is no guarding.  Musculoskeletal: He exhibits no edema.  Some muscle wasting  Neurological: He is alert and oriented to person, place, and time.  Skin: Skin is warm and dry.  Nursing note and vitals reviewed.           Vital Signs: BP (!) 141/69 (BP Location: Right Arm)   Pulse 67   Temp 98.5 F (36.9 C) (Oral)   Resp 18   Ht 5\' 7"  (1.702 m)   Wt 70.2 kg (154 lb 12.7 oz)   SpO2 100%   BMI 24.24 kg/m  SpO2: SpO2: 100 % O2 Device: O2 Device: Not Delivered O2 Flow Rate:    Intake/output summary:  Intake/Output Summary (Last 24 hours) at 06/07/17 1450 Last data filed at 06/06/17 2230  Gross per 24 hour  Intake              480 ml  Output                0 ml  Net              480 ml   LBM: Last BM Date: 06/06/17 Baseline Weight: Weight: 70.3 kg (155 lb) Most recent weight: Weight: 70.2 kg (154 lb 12.7 oz)       Palliative Assessment/Data:    Flowsheet Rows     Most Recent Value  Intake Tab  Referral Department  Hospitalist  Unit at Time of Referral  Med/Surg Unit  Palliative Care Primary Diagnosis  Cancer  Date Notified  06/06/17  Palliative Care Type  New Palliative care  Reason for referral  Clarify Goals of Care  Date of Admission  06/04/17  Date first seen by Palliative Care  06/06/17  # of days Palliative referral  response time  0 Day(s)  # of days IP prior to Palliative referral  2  Clinical Assessment  Palliative Performance Scale Score  40%  Pain Max last 24 hours  Not able to report  Pain Min Last 24 hours  Not able to report  Dyspnea Max Last 24 Hours  Not able to report  Dyspnea Min Last 24 hours  Not able to report  Psychosocial & Spiritual Assessment  Palliative Care Outcomes  Patient/Family meeting held?  Yes  Who was at the meeting?  patient, Andrew and Andrew [place]  Palliative Care Outcomes  Provided psychosocial or spiritual support, Provided end of life care assistance, Clarified goals of care  Patient/Family wishes: Interventions discontinued/not started   Mechanical Ventilation      Patient Active Problem List   Diagnosis Date Noted  . Palliative care encounter   . Goals of care, counseling/discussion   . Encounter for hospice care discussion   . Rectal mass 06/04/2017  . Rectal pain 06/04/2017  . AKI (acute kidney injury) ([place]) 06/04/2017  . Hypertension   . Hyperlipidemia   . Diabetes mellitus without complication [facility])     Palliative Care Assessment & Plan   Patient Profile: 83 y.o. 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   Assessment: New diagnosis of rectal cancer with metastatic burden  to lung and liver; Andrew Madden has decided to go forward with diverting colostomy scheduled for 6/21. The goal is palliation of G.I. symptoms.  Recommendations/Plan:  diverting colostomy scheduled for 6/21, goal is palliation of G.I. Symptoms, allowing for food and liquid intake. We discussed that DNR status will be revoked until he is stable and out of surgery.  Goals of Care and Additional Recommendations:  Limitations on Scope of Treatment: No CPR, no intubation  Code  Status:    Code Status Orders        Start     Ordered   06/04/17 2220  Do not attempt resuscitation (DNR)  Continuous    Question Answer Comment  In the event of cardiac or respiratory ARREST Do not call a "code blue"   In the event of cardiac or respiratory ARREST Do not perform Intubation, CPR, defibrillation or ACLS   In the event of cardiac or respiratory ARREST Use medication by any route, position, wound care, and other measures to relive pain and suffering. May use oxygen, suction and manual treatment of airway obstruction as needed for comfort.      06/04/17 2219    Code Status History    Date Active Date Inactive Code Status Order ID Comments User Context   06/04/2017  9:35 PM 06/04/2017 10:19 PM Full Code [id number]  Phillips Grout, MD ED       Prognosis:   < 3 months would not be surprising based on functional decline over the last 3 months, weight loss of 30 to 40 pounds over the last 3 months, metastatic cancer burden. If Mr. Lewinski decides to not have diverting colostomy, his prognosis could be reduced to weeks.  Discharge Planning:  Home with [place] was discussed with nursing staff, case manager, and Dr. Luan Pulling on next rounds..  Thank you for allowing the Palliative Medicine Team to assist in the care of this patient.   Time In: 1340 Time Out: 1410 Total Time 30 minutes  Prolonged Time Billed  no       Greater than 50%  of this time was spent counseling and coordinating care related to the above assessment and plan.  Drue Novel, NP  Please contact Palliative Medicine Team phone at [phone] for questions and concerns.

## 2017-06-07 NOTE — Progress Notes (Signed)
2 Days Post-Op  Subjective: Patient has decided to proceed with the surgery.  Objective: Vital signs in last 24 hours: Temp:  [98.3 F (36.8 C)-98.6 F (37 C)] 98.3 F (36.8 C) (06/20 0623) Pulse Rate:  [61-74] 74 (06/20 0623) Resp:  [18] 18 (06/20 0623) BP: (112-148)/(59-70) 120/59 (06/20 0623) SpO2:  [96 %-99 %] 98 % (06/20 0623) Last BM Date: 06/06/17  Intake/Output from previous day: 06/19 0701 - 06/20 0700 In: 480 [P.O.:480] Out: -  Intake/Output this shift: No intake/output data recorded.  General appearance: alert, cooperative and no distress GI: soft, non-tender; bowel sounds normal; no masses,  no organomegaly  Lab Results:   Recent Labs  06/04/17 1644 06/05/17 0517  WBC 11.1* 9.1  HGB 12.8* 11.6*  HCT 38.1* 35.1*  PLT 203 165   BMET  Recent Labs  06/04/17 1644 06/05/17 0517  NA 145 138  K 3.9 4.0  CL 103 103  CO2 28 27  GLUCOSE 143* 127*  BUN 29* 24*  CREATININE 1.59* 1.39*  CALCIUM 9.8 8.5*   PT/INR No results for input(s): LABPROT, INR in the last 72 hours.  Studies/Results: No results found.  Anti-infectives: Anti-infectives    None      Assessment/Plan: Impression: Near obstructing rectal carcinoma with metastatic disease, prognosis is poor Plan: The patient is scheduled for a transverse loop colostomy for palliation. He understands that this is not going to treat his cancer, but is a comfort measure to allow him to continue eating. The risks and benefits of the procedure were fully explained to the patient, gave informed consent.  LOS: 2 days    Aviva Signs 06/07/2017

## 2017-06-07 NOTE — Progress Notes (Deleted)
Last night, at time of assessment, Patient stated that external mid chest felt "tender" after Echo. Patient stated that it did not hurt much and did not feel internal. Now, patient states that this feeling is more aching, not external tenderness, and has expanded outward. Patient denies that this is pain but rates aching feeling 5/10. Patient unsure how to describe feeling. Patient stated that this feeling is constant, not coming on strong like chest pain. Patient having intermittent SOB with rest. Troponins already monitored. Patient scheduled for EKG this AM. Will continue to monitor 

## 2017-06-07 NOTE — Progress Notes (Signed)
Subjective: Note from nursing staff seen but I think it's on an incorrect patient. He is not on telemetry. He has no new complaints this morning. He says he has decided to go ahead with surgery. Discussed with patient and his son at bedside and they understand his poor prognosis but they also understand that having the colostomy may allow him to be more comfortable and may allow him to eat. He does not want hospice care.  Objective: Vital signs in last 24 hours: Temp:  [98.3 F (36.8 C)-98.6 F (37 C)] 98.3 F (36.8 C) (06/20 0623) Pulse Rate:  [61-74] 74 (06/20 0623) Resp:  [18] 18 (06/20 0623) BP: (112-148)/(59-70) 120/59 (06/20 0623) SpO2:  [96 %-99 %] 98 % (06/20 3382) Weight change:  Last BM Date: 06/06/17  Intake/Output from previous day: 06/19 0701 - 06/20 0700 In: 480 [P.O.:480] Out: -   PHYSICAL EXAM General appearance: alert, cooperative and Thin Resp: clear to auscultation bilaterally Cardio: regular rate and rhythm, S1, S2 normal, no murmur, click, rub or gallop GI: soft, non-tender; bowel sounds normal; no masses,  no organomegaly Extremities: extremities normal, atraumatic, no cyanosis or edema Skin turgor poor  Lab Results:  Results for orders placed or performed during the hospital encounter of 06/04/17 (from the past 48 hour(s))  Glucose, capillary     Status: Abnormal   Collection Time: 06/05/17 11:13 AM  Result Value Ref Range   Glucose-Capillary 124 (H) 65 - 99 mg/dL  Glucose, capillary     Status: Abnormal   Collection Time: 06/05/17  4:34 PM  Result Value Ref Range   Glucose-Capillary 127 (H) 65 - 99 mg/dL  Glucose, capillary     Status: Abnormal   Collection Time: 06/05/17  9:11 PM  Result Value Ref Range   Glucose-Capillary 141 (H) 65 - 99 mg/dL   Comment 1 Notify RN    Comment 2 Document in Chart   Glucose, capillary     Status: Abnormal   Collection Time: 06/06/17  8:04 AM  Result Value Ref Range   Glucose-Capillary 136 (H) 65 - 99 mg/dL   Comment 1 Notify RN    Comment 2 Document in Chart   Glucose, capillary     Status: Abnormal   Collection Time: 06/06/17 11:03 AM  Result Value Ref Range   Glucose-Capillary 191 (H) 65 - 99 mg/dL   Comment 1 Notify RN    Comment 2 Document in Chart   Glucose, capillary     Status: Abnormal   Collection Time: 06/06/17  4:06 PM  Result Value Ref Range   Glucose-Capillary 150 (H) 65 - 99 mg/dL  Glucose, capillary     Status: Abnormal   Collection Time: 06/06/17  9:52 PM  Result Value Ref Range   Glucose-Capillary 160 (H) 65 - 99 mg/dL   Comment 1 Notify RN    Comment 2 Document in Chart   Glucose, capillary     Status: Abnormal   Collection Time: 06/07/17  7:30 AM  Result Value Ref Range   Glucose-Capillary 137 (H) 65 - 99 mg/dL   Comment 1 Notify RN    Comment 2 Document in Chart     ABGS No results for input(s): PHART, PO2ART, TCO2, HCO3 in the last 72 hours.  Invalid input(s): PCO2 CULTURES No results found for this or any previous visit (from the past 240 hour(s)). Studies/Results: No results found.  Medications:  Prior to Admission:  Prescriptions Prior to Admission  Medication Sig Dispense Refill Last Dose  .  acetaminophen (TYLENOL) 325 MG tablet Take 650 mg by mouth every 6 (six) hours as needed for mild pain, fever or headache.   06/04/2017 at 0930  . aspirin 325 MG tablet Take 650 mg by mouth daily.    06/03/2017 at 1300  . hydrocortisone 2.5 % cream Apply 1 application topically 2 (two) times daily.   06/03/2017 at Unknown time  . losartan-hydrochlorothiazide (HYZAAR) 100-12.5 MG tablet Take 1 tablet by mouth daily.   06/03/2017 at 1300  . metFORMIN (GLUMETZA) 500 MG (MOD) 24 hr tablet Take 500 mg by mouth daily with breakfast.   06/03/2017 at Unknown time  . metoprolol tartrate (LOPRESSOR) 25 MG tablet Take 25 mg by mouth 2 (two) times daily.   06/03/2017 at 1300  . PROCTOZONE-HC 2.5 % rectal cream Apply 1 application topically 2 (two) times daily.   06/04/2017 at  Unknown time  . simvastatin (ZOCOR) 40 MG tablet Take 20 mg by mouth at bedtime.    06/03/2017 at Unknown time  . TRIPLE ANTIBIOTIC PLUS MAX ST 1 % OINT Apply 1 application topically 2 (two) times daily.   06/03/2017 at Unknown time   Scheduled: . feeding supplement  1 Container Oral TID BM  . feeding supplement (PRO-STAT SUGAR FREE 64)  30 mL Oral TID  . hydrocortisone  1 application Topical BID  . insulin aspart  0-9 Units Subcutaneous TID WC  . metoprolol tartrate  25 mg Oral BID  . simvastatin  20 mg Oral QHS   Continuous:  [id number], ALPRAZolam, ALPRAZolam, HYDROcodone-acetaminophen, lidocaine, ondansetron **OR** ondansetron (ZOFRAN) IV  Assesment: He was admitted with a rectal mass. He has metastatic disease from that. He had biopsy done 2 days ago but results are not back yet. The mass is near obstructing. Yesterday he said that he did not want to have colostomy but he has talked with his family overnight and agrees now. Principal Problem:   Rectal mass Active Problems:   Rectal pain   Hypertension   Hyperlipidemia   Diabetes mellitus without complication (HCC)   AKI (acute kidney injury) [facility])   Palliative care encounter   Goals of care, counseling/discussion   Encounter for hospice care discussion    Plan: For diverting colostomy tomorrow    LOS: 2 days   Naoko Diperna L 06/07/2017, 8:25 AM

## 2017-06-07 NOTE — Progress Notes (Deleted)
Patient noted to have several pauses in heartbeat per tele. Patient noted to have apneic periods at the same time as pauses. Message left for Dr. Cindie Laroche to call this RN. Patient easily arouses. Patient states that he has been told that he might have sleep apnea but has not been tested for this. Patient also has history of pauses in heartbeat and "skipping a beat". VS per flow sheet. No distress noted.

## 2017-06-08 ENCOUNTER — Encounter (HOSPITAL_COMMUNITY): Payer: Self-pay | Admitting: *Deleted

## 2017-06-08 ENCOUNTER — Inpatient Hospital Stay (HOSPITAL_COMMUNITY): Payer: Medicare HMO | Admitting: Anesthesiology

## 2017-06-08 ENCOUNTER — Encounter (HOSPITAL_COMMUNITY)
Admission: EM | Disposition: A | Discharge status: Home Health | Payer: Self-pay | Source: Home / Self Care | Attending: Pulmonary Disease

## 2017-06-08 HISTORY — PX: COLOSTOMY: SHX63

## 2017-06-08 LAB — GLUCOSE, CAPILLARY
GLUCOSE-CAPILLARY: 163 mg/dL — AB (ref 65–99)
Glucose-Capillary: 116 mg/dL — ABNORMAL HIGH (ref 65–99)
Glucose-Capillary: 135 mg/dL — ABNORMAL HIGH (ref 65–99)
Glucose-Capillary: 142 mg/dL — ABNORMAL HIGH (ref 65–99)
Glucose-Capillary: 142 mg/dL — ABNORMAL HIGH (ref 65–99)

## 2017-06-08 SURGERY — CREATION, COLOSTOMY
Anesthesia: General

## 2017-06-08 MED ORDER — FENTANYL CITRATE (PF) 100 MCG/2ML IJ SOLN: 25.0000 ug | INTRAMUSCULAR | Status: DC | PRN

## 2017-06-08 MED ORDER — SODIUM CHLORIDE 0.9 % IR SOLN
Status: DC | PRN
  Administered 2017-06-08: 1000 mL

## 2017-06-08 MED ORDER — LACTATED RINGERS IV SOLN
INTRAVENOUS | Status: DC
  Administered 2017-06-08: 11:00:00 via INTRAVENOUS

## 2017-06-08 MED ORDER — PROPOFOL 10 MG/ML IV BOLUS
INTRAVENOUS | Status: AC
  Filled 2017-06-08: qty 40

## 2017-06-08 MED ORDER — ALVIMOPAN 12 MG PO CAPS
ORAL_CAPSULE | ORAL | Status: AC
  Filled 2017-06-08: qty 1

## 2017-06-08 MED ORDER — FENTANYL CITRATE (PF) 250 MCG/5ML IJ SOLN
INTRAMUSCULAR | Status: AC
  Filled 2017-06-08: qty 5

## 2017-06-08 MED ORDER — KETOROLAC TROMETHAMINE 30 MG/ML IJ SOLN
30.0000 mg | Freq: Once | INTRAMUSCULAR | Status: AC
  Administered 2017-06-08: 30 mg via INTRAVENOUS
  Filled 2017-06-08: qty 1

## 2017-06-08 MED ORDER — ONDANSETRON HCL 4 MG/2ML IJ SOLN
INTRAMUSCULAR | Status: AC
  Filled 2017-06-08: qty 2

## 2017-06-08 MED ORDER — ARTIFICIAL TEARS OPHTHALMIC OINT
TOPICAL_OINTMENT | OPHTHALMIC | Status: AC
  Filled 2017-06-08: qty 7

## 2017-06-08 MED ORDER — GLYCOPYRROLATE 0.2 MG/ML IJ SOLN
INTRAMUSCULAR | Status: AC
  Filled 2017-06-08: qty 3

## 2017-06-08 MED ORDER — GLYCOPYRROLATE 0.2 MG/ML IJ SOLN
INTRAMUSCULAR | Status: DC | PRN
  Administered 2017-06-08: 0.6 mg via INTRAVENOUS

## 2017-06-08 MED ORDER — PROPOFOL 10 MG/ML IV BOLUS
INTRAVENOUS | Status: DC | PRN
  Administered 2017-06-08: 50 mg via INTRAVENOUS

## 2017-06-08 MED ORDER — ROCURONIUM BROMIDE 100 MG/10ML IV SOLN
INTRAVENOUS | Status: DC | PRN
  Administered 2017-06-08: 40 mg via INTRAVENOUS

## 2017-06-08 MED ORDER — LACTATED RINGERS IV SOLN
INTRAVENOUS | Status: DC
  Administered 2017-06-08: 09:00:00 via INTRAVENOUS
  Administered 2017-06-08: 1000 mL via INTRAVENOUS

## 2017-06-08 MED ORDER — LIDOCAINE HCL (PF) 1 % IJ SOLN
INTRAMUSCULAR | Status: AC
  Filled 2017-06-08: qty 5

## 2017-06-08 MED ORDER — SODIUM CHLORIDE 0.9 % IJ SOLN
INTRAMUSCULAR | Status: AC
  Filled 2017-06-08: qty 10

## 2017-06-08 MED ORDER — ALVIMOPAN 12 MG PO CAPS
12.0000 mg | ORAL_CAPSULE | Freq: Two times a day (BID) | ORAL | Status: DC
  Administered 2017-06-09: 12 mg via ORAL
  Filled 2017-06-08: qty 1

## 2017-06-08 MED ORDER — SUCCINYLCHOLINE CHLORIDE 20 MG/ML IJ SOLN
INTRAMUSCULAR | Status: AC
  Filled 2017-06-08: qty 1

## 2017-06-08 MED ORDER — EPHEDRINE SULFATE 50 MG/ML IJ SOLN
INTRAMUSCULAR | Status: AC
  Filled 2017-06-08: qty 1

## 2017-06-08 MED ORDER — ONDANSETRON HCL 4 MG/2ML IJ SOLN: 4.0000 mg | Freq: Once | INTRAMUSCULAR | Status: DC

## 2017-06-08 MED ORDER — ROCURONIUM BROMIDE 50 MG/5ML IV SOLN
INTRAVENOUS | Status: AC
  Filled 2017-06-08: qty 1

## 2017-06-08 MED ORDER — EPHEDRINE SULFATE 50 MG/ML IJ SOLN
INTRAMUSCULAR | Status: DC | PRN
  Administered 2017-06-08: 10 mg via INTRAVENOUS

## 2017-06-08 MED ORDER — SODIUM CHLORIDE 0.9 % IV SOLN
INTRAVENOUS | Status: DC | PRN
  Administered 2017-06-08: 1 g via INTRAVENOUS

## 2017-06-08 MED ORDER — NEOSTIGMINE METHYLSULFATE 10 MG/10ML IV SOLN
INTRAVENOUS | Status: DC | PRN
  Administered 2017-06-08: 4 mg via INTRAVENOUS

## 2017-06-08 MED ORDER — ALVIMOPAN 12 MG PO CAPS
12.0000 mg | ORAL_CAPSULE | ORAL | Status: AC
  Administered 2017-06-08: 12 mg via ORAL

## 2017-06-08 MED ORDER — FENTANYL CITRATE (PF) 100 MCG/2ML IJ SOLN
INTRAMUSCULAR | Status: DC | PRN
  Administered 2017-06-08: 25 ug via INTRAVENOUS
  Administered 2017-06-08: 50 ug via INTRAVENOUS
  Administered 2017-06-08: 25 ug via INTRAVENOUS
  Administered 2017-06-08: 50 ug via INTRAVENOUS

## 2017-06-08 MED ORDER — MIDAZOLAM HCL 2 MG/2ML IJ SOLN
1.0000 mg | INTRAMUSCULAR | Status: DC
  Administered 2017-06-08: 2 mg via INTRAVENOUS

## 2017-06-08 MED ORDER — NEOSTIGMINE METHYLSULFATE 10 MG/10ML IV SOLN
INTRAVENOUS | Status: AC
  Filled 2017-06-08: qty 1

## 2017-06-08 MED ORDER — MIDAZOLAM HCL 2 MG/2ML IJ SOLN
INTRAMUSCULAR | Status: AC
  Filled 2017-06-08: qty 2

## 2017-06-08 MED ORDER — LIDOCAINE HCL (CARDIAC) 20 MG/ML IV SOLN
INTRAVENOUS | Status: DC | PRN
  Administered 2017-06-08: 20 mg via INTRAVENOUS

## 2017-06-08 MED ORDER — POVIDONE-IODINE 10 % EX OINT
TOPICAL_OINTMENT | CUTANEOUS | Status: AC
  Filled 2017-06-08: qty 1

## 2017-06-08 SURGICAL SUPPLY — 27 items
APL SKNCLS STERI-STRIP NONHPOA (GAUZE/BANDAGES/DRESSINGS) ×1
BAG HAMPER (MISCELLANEOUS) ×2 IMPLANT
BAG UROSTOMY 4 LOOP 90 STRL (OSTOMY) ×1 IMPLANT
BENZOIN TINCTURE PRP APPL 2/3 (GAUZE/BANDAGES/DRESSINGS) ×1 IMPLANT
CHLORAPREP W/TINT 26ML (MISCELLANEOUS) ×2 IMPLANT
CLOTH BEACON ORANGE TIMEOUT ST (SAFETY) ×2 IMPLANT
COVER LIGHT HANDLE STERIS (MISCELLANEOUS) ×4 IMPLANT
DRAIN PENROSE 18X1/2 LTX STRL (DRAIN) ×1 IMPLANT
ELECT REM PT RETURN 9FT ADLT (ELECTROSURGICAL) ×2
ELECTRODE REM PT RTRN 9FT ADLT (ELECTROSURGICAL) ×1 IMPLANT
GLOVE BIO SURGEON STRL SZ7 (GLOVE) ×1 IMPLANT
GLOVE BIOGEL PI IND STRL 6.5 (GLOVE) IMPLANT
GLOVE BIOGEL PI IND STRL 7.0 (GLOVE) ×1 IMPLANT
GLOVE BIOGEL PI INDICATOR 6.5 (GLOVE) ×1
GLOVE BIOGEL PI INDICATOR 7.0 (GLOVE) ×3
GLOVE SURG SS PI 6.5 STRL IVOR (GLOVE) ×1 IMPLANT
GLOVE SURG SS PI 7.5 STRL IVOR (GLOVE) ×3 IMPLANT
GOWN STRL REUS W/TWL LRG LVL3 (GOWN DISPOSABLE) ×6 IMPLANT
INST SET MAJOR GENERAL (KITS) ×2 IMPLANT
KIT ROOM TURNOVER APOR (KITS) ×2 IMPLANT
MANIFOLD NEPTUNE II (INSTRUMENTS) ×2 IMPLANT
NS IRRIG 1000ML POUR BTL (IV SOLUTION) ×2 IMPLANT
PACK ABDOMINAL MAJOR (CUSTOM PROCEDURE TRAY) ×2 IMPLANT
PAD ARMBOARD 7.5X6 YLW CONV (MISCELLANEOUS) ×2 IMPLANT
SUT CHROMIC 3 0 PS 2 (SUTURE) ×1 IMPLANT
SUT SILK 3 0 SH CR/8 (SUTURE) ×3 IMPLANT
SUT VICRYL AB 3-0 FS1 BRD 27IN (SUTURE) ×1 IMPLANT

## 2017-06-08 NOTE — Progress Notes (Signed)
Rectal biopsy positive for adenocarcinoma.

## 2017-06-08 NOTE — Progress Notes (Signed)
Subjective: I saw him in preop. He says he feels okay. He has no new complaints. He is ready for surgery.  Objective: Vital signs in last 24 hours: Temp:  [97.6 F (36.4 C)-98.7 F (37.1 C)] 98.7 F (37.1 C) (06/21 0659) Pulse Rate:  [67-80] 80 (06/21 0659) Resp:  [18-33] 19 (06/21 0725) BP: (137-175)/(69-97) 137/75 (06/21 0725) SpO2:  [92 %-100 %] 94 % (06/21 0725) Weight:  [69.9 kg (154 lb)] 69.9 kg (154 lb) (06/21 0659) Weight change:  Last BM Date: 06/06/17  Intake/Output from previous day: 06/20 0701 - 06/21 0700 In: 1283.3 [P.O.:440; I.V.:843.3] Out: 2 [Urine:1; Stool:1]  PHYSICAL EXAM General appearance: alert, cooperative, no distress and Very hard of hearing Resp: clear to auscultation bilaterally Cardio: regular rate and rhythm, S1, S2 normal, no murmur, click, rub or gallop GI: soft, non-tender; bowel sounds normal; no masses,  no organomegaly Extremities: extremities normal, atraumatic, no cyanosis or edema Skin warm and dry  Lab Results:  Results for orders placed or performed during the hospital encounter of 06/04/17 (from the past 48 hour(s))  Glucose, capillary     Status: Abnormal   Collection Time: 06/06/17  8:04 AM  Result Value Ref Range   Glucose-Capillary 136 (H) 65 - 99 mg/dL   Comment 1 Notify RN    Comment 2 Document in Chart   Glucose, capillary     Status: Abnormal   Collection Time: 06/06/17 11:03 AM  Result Value Ref Range   Glucose-Capillary 191 (H) 65 - 99 mg/dL   Comment 1 Notify RN    Comment 2 Document in Chart   Glucose, capillary     Status: Abnormal   Collection Time: 06/06/17  4:06 PM  Result Value Ref Range   Glucose-Capillary 150 (H) 65 - 99 mg/dL  Glucose, capillary     Status: Abnormal   Collection Time: 06/06/17  9:52 PM  Result Value Ref Range   Glucose-Capillary 160 (H) 65 - 99 mg/dL   Comment 1 Notify RN    Comment 2 Document in Chart   Glucose, capillary     Status: Abnormal   Collection Time: 06/07/17  7:30 AM   Result Value Ref Range   Glucose-Capillary 137 (H) 65 - 99 mg/dL   Comment 1 Notify RN    Comment 2 Document in Chart   Glucose, capillary     Status: Abnormal   Collection Time: 06/07/17 11:21 AM  Result Value Ref Range   Glucose-Capillary 190 (H) 65 - 99 mg/dL  Glucose, capillary     Status: Abnormal   Collection Time: 06/07/17  4:29 PM  Result Value Ref Range   Glucose-Capillary 109 (H) 65 - 99 mg/dL   Comment 1 Notify RN    Comment 2 Document in Chart   Surgical pcr screen     Status: Abnormal   Collection Time: 06/07/17  6:20 PM  Result Value Ref Range   MRSA, PCR NEGATIVE NEGATIVE   Staphylococcus aureus POSITIVE (A) NEGATIVE    Comment:        The Xpert SA Assay (FDA approved for NASAL specimens in patients over 33 years of age), is one component of a comprehensive surveillance program.  Test performance has been validated by [facility] for patients greater than or equal to 31 year old. It is not intended to diagnose infection nor to guide or monitor treatment. RESULT CALLED TO, READ BACK BY AND VERIFIED WITH: DANIEL,C ON 06/07/17 AT 2250 BY LOY,C   Glucose, capillary  Status: Abnormal   Collection Time: 06/07/17  9:04 PM  Result Value Ref Range   Glucose-Capillary 128 (H) 65 - 99 mg/dL   Comment 1 Notify RN    Comment 2 Document in Chart   Glucose, capillary     Status: Abnormal   Collection Time: 06/08/17  6:39 AM  Result Value Ref Range   Glucose-Capillary 116 (H) 65 - 99 mg/dL    ABGS No results for input(s): PHART, PO2ART, TCO2, HCO3 in the last 72 hours.  Invalid input(s): PCO2 CULTURES Recent Results (from the past 240 hour(s))  Surgical pcr screen     Status: Abnormal   Collection Time: 06/07/17  6:20 PM  Result Value Ref Range Status   MRSA, PCR NEGATIVE NEGATIVE Final   Staphylococcus aureus POSITIVE (A) NEGATIVE Final    Comment:        The Xpert SA Assay (FDA approved for NASAL specimens in patients over 27 years of age), is one  component of a comprehensive surveillance program.  Test performance has been validated by [facility] for patients greater than or equal to 97 year old. It is not intended to diagnose infection nor to guide or monitor treatment. RESULT CALLED TO, READ BACK BY AND VERIFIED WITH: DANIEL,C ON 06/07/17 AT 2250 BY LOY,C    Studies/Results: No results found.  Medications:  Prior to Admission:  Prescriptions Prior to Admission  Medication Sig Dispense Refill Last Dose  . acetaminophen (TYLENOL) 325 MG tablet Take 650 mg by mouth every 6 (six) hours as needed for mild pain, fever or headache.   06/04/2017 at 0930  . aspirin 325 MG tablet Take 650 mg by mouth daily.    06/03/2017 at 1300  . hydrocortisone 2.5 % cream Apply 1 application topically 2 (two) times daily.   06/03/2017 at Unknown time  . losartan-hydrochlorothiazide (HYZAAR) 100-12.5 MG tablet Take 1 tablet by mouth daily.   06/03/2017 at 1300  . metFORMIN (GLUMETZA) 500 MG (MOD) 24 hr tablet Take 500 mg by mouth daily with breakfast.   06/03/2017 at Unknown time  . metoprolol tartrate (LOPRESSOR) 25 MG tablet Take 25 mg by mouth 2 (two) times daily.   06/03/2017 at 1300  . PROCTOZONE-HC 2.5 % rectal cream Apply 1 application topically 2 (two) times daily.   06/04/2017 at Unknown time  . simvastatin (ZOCOR) 40 MG tablet Take 20 mg by mouth at bedtime.    06/03/2017 at Unknown time  . TRIPLE ANTIBIOTIC PLUS MAX ST 1 % OINT Apply 1 application topically 2 (two) times daily.   06/03/2017 at Unknown time   Scheduled: . Chlorhexidine Gluconate Cloth  6 each Topical Once  . [MAR Hold] Chlorhexidine Gluconate Cloth  6 each Topical Daily  . [MAR Hold] enoxaparin (LOVENOX) injection  40 mg Subcutaneous Q24H  . [MAR Hold] feeding supplement  1 Container Oral TID BM  . [MAR Hold] feeding supplement (PRO-STAT SUGAR FREE 64)  30 mL Oral TID  . [MAR Hold] hydrocortisone  1 application Topical BID  . [MAR Hold] insulin aspart  0-9 Units Subcutaneous  TID WC  . [MAR Hold] metoprolol tartrate  25 mg Oral BID  . [MAR Hold] mupirocin ointment  1 application Nasal BID  . ondansetron (ZOFRAN) IV  4 mg Intravenous Once  . [MAR Hold] simvastatin  20 mg Oral QHS   Continuous: . lactated ringers 50 mL/hr at 06/08/17 0438  . lactated ringers 1,000 mL (06/08/17 0708)   PRN:[MAR Hold] acetaminophen, [MAR Hold] ALPRAZolam, [MAR  Hold] ALPRAZolam, [MAR Hold] HYDROcodone-acetaminophen, [MAR Hold] lidocaine, [MAR Hold] ondansetron **OR** [MAR Hold] ondansetron (ZOFRAN) IV  Assesment: He has a rectal mass and biopsy shows adenocarcinoma. He is near obstructing and is  undergoing a palliative loop colostomy. Principal Problem:   Rectal mass Active Problems:   Rectal pain   Hypertension   Hyperlipidemia   Diabetes mellitus without complication (HCC)   AKI (acute kidney injury) [facility])   Palliative care encounter   Goals of care, counseling/discussion   Encounter for hospice care discussion    Plan: For surgery today    LOS: 3 days   Alasia Enge L 06/08/2017, 7:35 AM

## 2017-06-08 NOTE — Anesthesia Postprocedure Evaluation (Signed)
Anesthesia Post Note  Patient: Andrew Madden  Procedure(s) Performed: Procedure(s) (LRB): TRANSVERSE LOOP COLOSTOMY FOR PALLIATION (N/A)  Patient location during evaluation: PACU Anesthesia Type: General Level of consciousness: awake Pain management: pain level controlled Vital Signs Assessment: post-procedure vital signs reviewed and stable Respiratory status: spontaneous breathing Cardiovascular status: stable Postop Assessment: no signs of nausea or vomiting Anesthetic complications: no     Last Vitals:  Vitals:   06/08/17 0725 06/08/17 0855  BP: 137/75   Pulse:    Resp: 19   Temp:  36.5 C    Last Pain:  Vitals:   06/08/17 0855  TempSrc:   PainSc: Asleep    LLE Motor Response: (P) Responds to commands;Purposeful movement (06/08/17 0904)   RLE Motor Response: (P) Purposeful movement;Responds to commands (06/08/17 0904)        Ixel Boehning A

## 2017-06-08 NOTE — H&P (View-Only) (Signed)
2 Days Post-Op  Subjective: Patient has decided to proceed with the surgery.  Objective: Vital signs in last 24 hours: Temp:  [98.3 F (36.8 C)-98.6 F (37 C)] 98.3 F (36.8 C) (06/20 0623) Pulse Rate:  [61-74] 74 (06/20 0623) Resp:  [18] 18 (06/20 0623) BP: (112-148)/(59-70) 120/59 (06/20 0623) SpO2:  [96 %-99 %] 98 % (06/20 0623) Last BM Date: 06/06/17  Intake/Output from previous day: 06/19 0701 - 06/20 0700 In: 480 [P.O.:480] Out: -  Intake/Output this shift: No intake/output data recorded.  General appearance: alert, cooperative and no distress GI: soft, non-tender; bowel sounds normal; no masses,  no organomegaly  Lab Results:   Recent Labs  06/04/17 1644 06/05/17 0517  WBC 11.1* 9.1  HGB 12.8* 11.6*  HCT 38.1* 35.1*  PLT 203 165   BMET  Recent Labs  06/04/17 1644 06/05/17 0517  NA 145 138  K 3.9 4.0  CL 103 103  CO2 28 27  GLUCOSE 143* 127*  BUN 29* 24*  CREATININE 1.59* 1.39*  CALCIUM 9.8 8.5*   PT/INR No results for input(s): LABPROT, INR in the last 72 hours.  Studies/Results: No results found.  Anti-infectives: Anti-infectives    None      Assessment/Plan: Impression: Near obstructing rectal carcinoma with metastatic disease, prognosis is poor Plan: The patient is scheduled for a transverse loop colostomy for palliation. He understands that this is not going to treat his cancer, but is a comfort measure to allow him to continue eating. The risks and benefits of the procedure were fully explained to the patient, gave informed consent.  LOS: 2 days    Aviva Signs 06/07/2017

## 2017-06-08 NOTE — Interval H&P Note (Signed)
History and Physical Interval Note:  06/08/2017 7:03 AM  Katheren Shams  has presented today for surgery, with the diagnosis of rectal neoplasm, palliation  The various methods of treatment have been discussed with the patient and family. After consideration of risks, benefits and other options for treatment, the patient has consented to  Procedure(s): TRANSVERSE LOOP COLOSTOMY (N/A) as a surgical intervention .  The patient's history has been reviewed, patient examined, no change in status, stable for surgery.  I have reviewed the patient's chart and labs.  Questions were answered to the patient's satisfaction.     Aviva Signs

## 2017-06-08 NOTE — Plan of Care (Signed)
Problem: Pain Managment: Goal: General experience of comfort will improve Outcome: Progressing Pt c/o rectal pain x1 and given hydrocodone. Pt using cream for rectum as well, stating it is not working as well as he had hoped. Pt very hard of hearing and wife speaks up to him. Pt states he just wants to sleep and doesn't want to be bothered. Pt for surgery today.

## 2017-06-08 NOTE — Op Note (Signed)
Patient:  Andrew Madden  DOB:  1929-05-05  MRN:  [id number]   Preop Diagnosis:  Metastatic rectal carcinoma  Postop Diagnosis:  Same  Procedure:  Transverse loop colostomy, palliative  Surgeon:  Aviva Signs, M.D.  Anes:  Gen. endotracheal  Indications:  Patient is an 83 year old white male who was recently found to have a near obstructing rectal carcinoma with metastatic disease to the liver. He now presents for a transverse loop colostomy for palliation. The risks and benefits of the procedure were fully explained to the patient, who gave informed consent.  Procedure note:  The patient was placed in the supine position. After induction of general endotracheal anesthesia, the abdomen was prepped and draped using usual sterile technique with ChloraPrep. Surgical site confirmation was performed.  A small transverse incision was made halfway between the subxiphoid process in the umbilicus. The fascia was entered longitudinally and the peritoneal cavity was entered into without difficulty. The midportion of the transverse colon was then brought out through this wound. Surrounding omentum was removed using Bovie electrocautery. A small metastatic nodule was found and this was sent to pathology. The transverse loop was then placed through a defect in the mesentery. 3-0 silk sutures were placed serosal area at the fascial level on either side to fixate the loop. The skin edges on either side of the colostomy were closed using 3-0 Vicryl subcuticular sutures. The colostomy wafer was then applied. The hole was approximately 1-3/4 inches in size. An incision was made longitudinally along the colostomy. 3-0 chromic gut interrupted sutures were placed on either side of the colostomy to prevent any further retraction. The colostomy bag was then applied.  All tape and needle counts were correct at the end the procedure. Patient was extubated in the operating room and transferred to PACU in stable  condition.  Complications:  None  EBL:  Minimal  Specimen:  Omentum with possible metastatic implant

## 2017-06-08 NOTE — Progress Notes (Signed)
Daily Progress Note   Patient Name: Andrew Madden       Date: 06/08/2017 DOB: 11/18/1929  Age: 83 y.o. MRN#: [id number] Attending Physician: Sinda Du, MD Primary Care Physician: Sinda Du, MD Admit Date: 06/04/2017  Reason for Consultation/Follow-up: Establishing goals of care and Psychosocial/spiritual support  Subjective: Andrew Madden is resting quietly in bed. He makes and keeps eye contact as I enter. He denies pain at this time, and states he has been sleeping. Present today at bedside is son Andrew Madden, sister and brother-in-law. Andrew Madden states that he has not looked at his stomach yet, and he wonders what it looks like. I encourage him to just rest today, we will look at his stomach tomorrow.  Andrew Madden states that he feels like everything is a dream. I share that this is normal after surgery, and related to the medication he received. I encourage patient and family to continued to rest today. I share that when he is tired he should just close his eyes.  Covers with nursing staff about pain medication.  Length of Stay: 3  Current Medications: Scheduled Meds:  . [START ON 06/09/2017] alvimopan  12 mg Oral BID  . Chlorhexidine Gluconate Cloth  6 each Topical Once  . Chlorhexidine Gluconate Cloth  6 each Topical Daily  . enoxaparin (LOVENOX) injection  40 mg Subcutaneous Q24H  . feeding supplement  1 Container Oral TID BM  . feeding supplement (PRO-STAT SUGAR FREE 64)  30 mL Oral TID  . hydrocortisone  1 application Topical BID  . insulin aspart  0-9 Units Subcutaneous TID WC  . metoprolol tartrate  25 mg Oral BID  . mupirocin ointment  1 application Nasal BID  . simvastatin  20 mg Oral QHS    Continuous Infusions: . lactated ringers 50 mL/hr at 06/08/17 0438  . lactated  ringers 50 mL/hr at 06/08/17 1030    PRN Meds: acetaminophen, ALPRAZolam, ALPRAZolam, HYDROcodone-acetaminophen, lidocaine, ondansetron **OR** ondansetron (ZOFRAN) IV  Physical Exam  Constitutional: No distress.  Nursing note and vitals reviewed.           Vital Signs: BP (!) 168/83   Pulse 78   Temp 98 F (36.7 C)   Resp 16   Ht 5\' 7"  (1.702 m)   Wt 69.9 kg (154 lb)   SpO2 98%  BMI 24.12 kg/m  SpO2: SpO2: 98 % O2 Device: O2 Device: Nasal Cannula O2 Flow Rate: O2 Flow Rate (L/min): 2 L/min  Intake/output summary:  Intake/Output Summary (Last 24 hours) at 06/08/17 1157 Last data filed at 06/08/17 0855  Gross per 24 hour  Intake          2383.34 ml  Output               27 ml  Net          2356.34 ml   LBM: Last BM Date: 06/06/17 Baseline Weight: Weight: 70.3 kg (155 lb) Most recent weight: Weight: 69.9 kg (154 lb)       Palliative Assessment/Data:    Flowsheet Rows     Most Recent Value  Intake Tab  Referral Department  Hospitalist  Unit at Time of Referral  Med/Surg Unit  Palliative Care Primary Diagnosis  Cancer  Date Notified  06/06/17  Palliative Care Type  New Palliative care  Reason for referral  Clarify Goals of Care  Date of Admission  06/04/17  Date first seen by Palliative Care  06/06/17  # of days Palliative referral response time  0 Day(s)  # of days IP prior to Palliative referral  2  Clinical Assessment  Palliative Performance Scale Score  40%  Pain Max last 24 hours  Not able to report  Pain Min Last 24 hours  Not able to report  Dyspnea Max Last 24 Hours  Not able to report  Dyspnea Min Last 24 hours  Not able to report  Psychosocial & Spiritual Assessment  Palliative Care Outcomes  Patient/Family meeting held?  Yes  Who was at the meeting?  patient, wife and grandson [place]  Palliative Care Outcomes  Provided psychosocial or spiritual support, Provided end of life care assistance, Clarified goals of care  Patient/Family wishes:  Interventions discontinued/not started   Mechanical Ventilation      Patient Active Problem List   Diagnosis Date Noted  . Palliative care encounter   . Goals of care, counseling/discussion   . Encounter for hospice care discussion   . Rectal mass 06/04/2017  . Rectal pain 06/04/2017  . AKI (acute kidney injury) ([place]) 06/04/2017  . Hypertension   . Hyperlipidemia   . 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   Assessment: New diagnosis of rectal cancer with metastatic burden to lung and liver; Andrew Madden has decided to go forward with diverting colostomy scheduled for 6/21. The goal is palliation of G.I. Symptoms. Surgery 2/95 no complications at this time. Goal dc home.   Recommendations/Plan:  diverting colostomy scheduled for 6/21, goal is palliation of G.I. Symptoms, allowing for food and liquid intake. Would benefit from [facility] services, unsure if wife will accept.   Goals of Care and Additional Recommendations:  Limitations on Scope of Treatment: No CPR, no intubation; No Oncology services  Code Status:    Code Status Orders        Start     Ordered   06/04/17 2220  Do not attempt resuscitation (DNR)  Continuous    Question Answer Comment  In the event of cardiac or respiratory ARREST Do not call a "code blue"  In the event of cardiac or respiratory ARREST Do not perform Intubation, CPR, defibrillation or ACLS   In the event of cardiac or respiratory ARREST Use medication by any route, position, wound care, and other measures to relive pain and suffering. May use oxygen, suction and manual treatment of airway obstruction as needed for comfort.      06/04/17  2219    Code Status History    Date Active Date Inactive Code Status Order ID Comments User Context   06/04/2017  9:35 PM 06/04/2017 10:19 PM Full Code [id number]  Phillips Grout, MD ED       Prognosis:   < 3 months would not be surprising based on functional decline over the last 3 months, weight loss of 30 to 40 pounds over the last 3 months, metastatic cancer burden.   Discharge Planning:  Home with Home Health, if patient and family agree  Care plan was discussed with nursing staff, case manager, and Dr. Luan Pulling  Thank you for allowing the Palliative Medicine Team to assist in the care of this patient.   Time In: 1110 Time Out: 1130 Total Time 20 minutes Prolonged Time Billed  no       Greater than 50%  of this time was spent counseling and coordinating care related to the above assessment and plan.  Drue Novel, NP  Please contact Palliative Medicine Team phone at [phone] for questions and concerns.

## 2017-06-08 NOTE — Anesthesia Preprocedure Evaluation (Signed)
Anesthesia Evaluation  Patient identified by MRN, date of birth, ID band Patient awake    Reviewed: Allergy & Precautions, NPO status , Patient's Chart, lab work & pertinent test results  Airway Mallampati: II  TM Distance: >3 FB     Dental  (+) Edentulous Upper, Edentulous Lower   Pulmonary former smoker,    breath sounds clear to auscultation       Cardiovascular hypertension, Pt. on medications (-) angina+ Past MI   Rhythm:Regular Rate:Normal     Neuro/Psych negative neurological ROS  negative psych ROS   GI/Hepatic   Endo/Other  diabetes, Type 2, Oral Hypoglycemic Agents  Renal/GU      Musculoskeletal   Abdominal   Peds  Hematology   Anesthesia Other Findings   Reproductive/Obstetrics                             Anesthesia Physical Anesthesia Plan  ASA: III  Anesthesia Plan: General   Post-op Pain Management:    Induction: Intravenous  PONV Risk Score and Plan:   Airway Management Planned: Oral ETT  Additional Equipment:   Intra-op Plan:   Post-operative Plan: Extubation in OR  Informed Consent: I have reviewed the patients History and Physical, chart, labs and discussed the procedure including the risks, benefits and alternatives for the proposed anesthesia with the patient or authorized representative who has indicated his/her understanding and acceptance.     Plan Discussed with:   Anesthesia Plan Comments:         Anesthesia Quick Evaluation

## 2017-06-08 NOTE — Transfer of Care (Signed)
Immediate Anesthesia Transfer of Care Note  Patient: Andrew Madden  Procedure(s) Performed: Procedure(s): TRANSVERSE LOOP COLOSTOMY FOR PALLIATION (N/A)  Patient Location: PACU  Anesthesia Type:General  Level of Consciousness: drowsy  Airway & Oxygen Therapy: Patient Spontanous Breathing and Patient connected to face mask oxygen  Post-op Assessment: Report given to RN and Post -op Vital signs reviewed and stable  Post vital signs: Reviewed and stable  Last Vitals:  Vitals:   06/08/17 0720 06/08/17 0725  BP: (!) 146/79 137/75  Pulse:    Resp: 20 19  Temp:      Last Pain:  Vitals:   06/08/17 0659  TempSrc: Oral  PainSc: 0-No pain      Patients Stated Pain Goal: 8 (27/06/23 7628)  Complications: No apparent anesthesia complications

## 2017-06-08 NOTE — Progress Notes (Signed)
Pt arrived back on the floor. Vitals stable, Complains of no pain. Still moderately drowsy. Will continue to monitor.

## 2017-06-08 NOTE — Anesthesia Procedure Notes (Signed)
Procedure Name: Intubation Date/Time: 06/08/2017 7:43 AM Performed by: Andree Elk, AMY A Pre-anesthesia Checklist: Patient identified, Patient being monitored, Timeout performed, Emergency Drugs available and Suction available Patient Re-evaluated:Patient Re-evaluated prior to inductionOxygen Delivery Method: Circle System Utilized Preoxygenation: Pre-oxygenation with 100% oxygen Intubation Type: IV induction Ventilation: Mask ventilation without difficulty Laryngoscope Size: Mac and 4 Grade View: Grade I Tube type: Oral Tube size: 7.0 mm Number of attempts: 1 Airway Equipment and Method: Stylet Placement Confirmation: ETT inserted through vocal cords under direct vision,  positive ETCO2 and breath sounds checked- equal and bilateral Secured at: 21 cm Tube secured with: Tape Dental Injury: Teeth and Oropharynx as per pre-operative assessment

## 2017-06-09 ENCOUNTER — Encounter (HOSPITAL_COMMUNITY): Payer: Self-pay | Admitting: Internal Medicine

## 2017-06-09 DIAGNOSIS — Z23 Encounter for immunization: Secondary | ICD-10-CM | POA: Diagnosis not present

## 2017-06-09 LAB — BASIC METABOLIC PANEL
ANION GAP: 7 (ref 5–15)
BUN: 16 mg/dL (ref 6–20)
CALCIUM: 8.1 mg/dL — AB (ref 8.9–10.3)
CO2: 25 mmol/L (ref 22–32)
CREATININE: 1.15 mg/dL (ref 0.61–1.24)
Chloride: 103 mmol/L (ref 101–111)
GFR calc Af Amer: >60 mL/min (ref >60)
GFR, EST NON AFRICAN AMERICAN: 56 mL/min — AB (ref >60)
GLUCOSE: 152 mg/dL — AB (ref 65–99)
Potassium: 3.5 mmol/L (ref 3.5–5.1)
Sodium: 135 mmol/L (ref 135–145)

## 2017-06-09 LAB — GLUCOSE, CAPILLARY: GLUCOSE-CAPILLARY: 126 mg/dL — AB (ref 65–99)

## 2017-06-09 LAB — CBC
HCT: 30.9 % — ABNORMAL LOW (ref 39.0–52.0)
HEMOGLOBIN: 10.7 g/dL — AB (ref 13.0–17.0)
MCH: 30.1 pg (ref 26.0–34.0)
MCHC: 34.6 g/dL (ref 30.0–36.0)
MCV: 87 fL (ref 78.0–100.0)
Platelets: 148 10*3/uL — ABNORMAL LOW (ref 150–400)
RBC: 3.55 MIL/uL — ABNORMAL LOW (ref 4.22–5.81)
RDW: 14.2 % (ref 11.5–15.5)
WBC: 8.4 10*3/uL (ref 4.0–10.5)

## 2017-06-09 MED ORDER — BOOST / RESOURCE BREEZE PO LIQD: 1.0000 | Freq: Three times a day (TID) | ORAL | 0 refills | Status: AC

## 2017-06-09 MED ORDER — ALPRAZOLAM 0.25 MG PO TABS: 0.2500 mg | ORAL_TABLET | Freq: Every evening | ORAL | 0 refills | Status: AC | PRN

## 2017-06-09 MED ORDER — LIDOCAINE HCL 2 % EX GEL: 1.0000 "application " | CUTANEOUS | 0 refills | Status: AC | PRN

## 2017-06-09 MED ORDER — ALPRAZOLAM 0.25 MG PO TABS: 0.2500 mg | ORAL_TABLET | Freq: Three times a day (TID) | ORAL | 0 refills | Status: AC | PRN

## 2017-06-09 MED ORDER — HYDROCODONE-ACETAMINOPHEN 5-325 MG PO TABS: 1.0000 | ORAL_TABLET | ORAL | 0 refills | Status: AC | PRN

## 2017-06-09 NOTE — Plan of Care (Signed)
Problem: Pain Managment: Goal: General experience of comfort will improve Outcome: Progressing Pt post-op day 1 from surgery. Pain medication and anxiety medication given x1 for abdominal pain. Pt resting quietly in room with son at bedside. Ostomy in place with a little redness. Will continue to monitor pt and site.

## 2017-06-09 NOTE — Discharge Summary (Signed)
Physician Discharge Summary  Patient ID: Andrew Madden MRN: [id number] DOB/AGE: 1929/09/03 83 y.o. 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   ALPRAZolam 0.25 MG tablet Commonly known as:  XANAX Take 1 tablet (0.25 mg total) by mouth 3 (three) times daily as needed for anxiety.   ALPRAZolam 0.25 MG tablet Commonly known as:  XANAX Take 1 tablet (0.25 mg total) by mouth at bedtime as needed for sleep.   aspirin 325 MG tablet Take 650 mg by mouth daily.   feeding supplement Liqd Take 1 Container by mouth 3 (three) times daily between meals.   HYDROcodone-acetaminophen 5-325 MG tablet Commonly known as:  NORCO/VICODIN Take 1-2 tablets by mouth every 4 (four) hours as needed for moderate pain.   hydrocortisone 2.5 % cream Apply 1 application topically 2 (two) times daily.   lidocaine 2 % jelly Commonly known as:  XYLOCAINE Apply 1 application topically every 4 (four) hours as needed (itching). Apply every 4 hours as needed for rectal pain   losartan-hydrochlorothiazide 100-12.5 MG tablet Commonly known as:  HYZAAR Take 1 tablet by mouth daily.   metFORMIN 500 MG  (MOD) 24 hr tablet Commonly known as:  GLUMETZA Take 500 mg by mouth daily with breakfast.   metoprolol tartrate 25 MG tablet Commonly known as:  LOPRESSOR Take 25 mg by mouth 2 (two) times daily.   PROCTOZONE-HC 2.5 % rectal cream Generic drug:  hydrocortisone Apply 1 application topically 2 (two) times daily.   simvastatin 40 MG tablet Commonly known as:  ZOCOR Take 20 mg by mouth at bedtime.       Discharged Condition:Improved    Consults: Gen. surgery and gastroenterology, palliative care  Significant Diagnostic Studies: Ct Abdomen Pelvis W Contrast  Result Date: 06/04/2017 CLINICAL DATA:  84 year old male with rectal pain and weight loss. EXAM: CT ABDOMEN AND PELVIS WITH CONTRAST TECHNIQUE: Multidetector CT imaging of the abdomen and pelvis was performed using the standard protocol following bolus administration of intravenous contrast. CONTRAST:  50mL ISOVUE-300 IOPAMIDOL (ISOVUE-300) INJECTION 61% COMPARISON:  None. FINDINGS: Lower chest: There is an 8 mm right middle lobe pulmonary nodule. A 5 mm nodule is also seen in the right middle lobe. Small pulmonary nodules including a 4 mm left lower lobe as well as a 5 mm right lower lobe noted. Nonemergent CT of the chest is recommended for complete evaluation of the lungs. There is coronary vascular calcification primarily involving the LAD, and RCA. There is an area of calcification involving the inferior wall of the left ventricle which may be related to prior infarct. There is no intra-abdominal free air or free fluid. Hepatobiliary: There multiple hepatic hypodense lesions. The  largest lesion or confluence of adjacent lesions in the right lobe of the liver measure up to 7.2 x 7.2 cm and most consistent with metastatic disease. There is no intrahepatic biliary ductal dilatation. The gallbladder is mildly distended. No calcified gallstone. No pericholecystic fluid. Pancreas: Unremarkable. No pancreatic ductal dilatation or  surrounding inflammatory changes. Spleen: Normal in size without focal abnormality. Adrenals/Urinary Tract: Mildly thickened left adrenal gland may represent hyperplasia versus thickening. No discrete nodule. The right kidney is atrophic. Multiple right renal hypodense lesions are noted measuring up to 2.7 cm. These lesions demonstrate fluid attenuation most consistent with cysts. Subcentimeter right renal hypodense lesions are too small to characterize but also likely represent cysts. There is cortical irregularity of the left kidney, likely related to prior infarct. There is a 2 cm left renal upper pole cyst. No hydronephrosis. The visualized ureters appear unremarkable. The urinary bladder is mildly distended. There is trabecular appearance of the bladder wall consistent with chronic bladder outlet obstruction. There is extension of the bladder into the upper right inguinal canal. Stomach/Bowel: There is irregular and thickened appearance of the rectal wall (series 2, image 93 and coronal series 6, image 81). There is sigmoid diverticulosis with muscular hypertrophy. Mild perisigmoid stranding likely represent chronic inflammation and scarring and less likely active inflammation. Moderate amount of stool noted throughout the colon and within the rectosigmoid. There is no evidence of bowel obstruction. Normal appendix. Vascular/Lymphatic: There is advanced aortoiliac atherosclerotic disease. The origins of the celiac axis, SMA, IMA appear patent. The SMV, splenic vein, and main portal vein are patent. The IVC appears unremarkable. No portal venous gas identified. There is no adenopathy. Reproductive: The prostate gland is enlarged and heterogeneous measuring up to 6 cm in transverse axial diameter. Other: There are small left and moderate size right inguinal hernia containing fat. Musculoskeletal: There is degenerative changes of the spine. No acute fracture. IMPRESSION: 1. Thickened and irregular rectal wall  concerning for a rectal mass. Correlation with clinical exam and sigmoidoscopy or colonoscopy recommended. There may be associated mass effect and luminal narrowing of the rectum with moderate amount of residual stool burden throughout the colon. 2. Sigmoid diverticulosis with evidence of chronic inflammation. No definite active inflammatory changes. 3. No bowel obstruction.  Normal appendix. 4. Hepatic hypodense lesions most consistent with metastatic disease. 5. Multiple pulmonary nodules as described concerning for metastatic disease. Nonemergent CT of the chest is recommended for complete evaluation of the lungs. 6.  Aortic Atherosclerosis (ICD10-I70.0). 7. Coronary vascular calcification. 8. Right renal atrophy. Electronically Signed   By: Anner Crete M.D.   On: 06/04/2017 19:25    Lab Results: Basic Metabolic Panel:  Recent Labs  06/09/17 0440  NA 135  K 3.5  CL 103  CO2 25  GLUCOSE 152*  BUN 16  CREATININE 1.15  CALCIUM 8.1*   Liver Function Tests: No results for input(s): AST, ALT, ALKPHOS, BILITOT, PROT, ALBUMIN in the last 72 hours.   CBC:  Recent Labs  06/09/17 0440  WBC 8.4  HGB 10.7*  HCT 30.9*  MCV 87.0  PLT 148*    Recent Results (from the past 240 hour(s))  Surgical pcr screen     Status: Abnormal   Collection Time: 06/07/17  6:20 PM  Result Value Ref Range Status   MRSA, PCR NEGATIVE NEGATIVE Final   Staphylococcus aureus POSITIVE (A) NEGATIVE Final    Comment:        The Xpert SA Assay (FDA approved for NASAL specimens in  patients over 90 years of age), is one component of a comprehensive surveillance program.  Test performance has been validated by [facility] for patients greater than or equal to 48 year old. It is not intended to diagnose infection nor to guide or monitor treatment. RESULT CALLED TO, READ BACK BY AND VERIFIED WITH: DANIEL,C ON 06/07/17 AT [facility]: This is an 83 year old who's had an  approximately three-month history of rectal discomfort. He thought he had hemorrhoids and had called my office for Anusol ointment which was provided. However he continued to have trouble. He lost about 30 pounds. He eventually went to the [facility] in [place] and was seen in the emergency room . No diagnosis was established at that time and he was discharged home. He then came to the emergency room here and CT scan showed that he had a rectal mass and what appeared to be metastatic disease to liver and lung. GI consultation was obtained and he underwent sigmoidoscopy and biopsy. The biopsy was positive for adenocarcinoma. He initially stated that he did not want any treatment but later decided to go ahead with diverting colostomy because he was near obstructed at the time of diagnosis. This was done on the 21st. We're arranging teaching for the family and he wants to go home. He ate very well the day of surgery. He will be discharged with home health services. We had discussion regarding radiation treatments versus chemotherapy and he does not want to pursue that. He does not want to pursue hospice care.  Discharge Exam: Blood pressure (!) 151/87, pulse 86, temperature 98.4 F (36.9 C), temperature source Oral, resp. rate 18, height 5\' 7"  (1.702 m), weight 69.9 kg (154 lb), SpO2 98 %. He's awake and alert. Colostomy looks okay. Chest is clear.  Disposition: Home with home health services      Signed: Shataria Crist L   06/09/2017, 8:45 AM

## 2017-06-09 NOTE — Progress Notes (Signed)
Provided emotional and spiritual support for patient and his family while here.

## 2017-06-09 NOTE — Progress Notes (Signed)
Subjective: He says he feels okay. He hopes to go home later today. He had colostomy yesterday. He needs to have teaching from wound ostomy nurse. He agrees to home health might be helpful.  Objective: Vital signs in last 24 hours: Temp:  [97.7 F (36.5 C)-98.7 F (37.1 C)] 98.4 F (36.9 C) (06/22 0651) Pulse Rate:  [78-99] 86 (06/22 0651) Resp:  [14-18] 18 (06/22 0651) BP: (131-176)/(67-97) 151/87 (06/22 0651) SpO2:  [95 %-100 %] 98 % (06/22 0651) Weight change:  Last BM Date: 06/08/17  Intake/Output from previous day: 06/21 0701 - 06/22 0700 In: 3820 [P.O.:640; I.V.:3180] Out: 25 [Blood:25]  PHYSICAL EXAM General appearance: alert, cooperative and no distress Resp: clear to auscultation bilaterally Cardio: regular rate and rhythm, S1, S2 normal, no murmur, click, rub or gallop GI: Ostomy site looks okay Extremities: extremities normal, atraumatic, no cyanosis or edema Skin warm and dry  Lab Results:  Results for orders placed or performed during the hospital encounter of 06/04/17 (from the past 48 hour(s))  Glucose, capillary     Status: Abnormal   Collection Time: 06/07/17 11:21 AM  Result Value Ref Range   Glucose-Capillary 190 (H) 65 - 99 mg/dL  Glucose, capillary     Status: Abnormal   Collection Time: 06/07/17  4:29 PM  Result Value Ref Range   Glucose-Capillary 109 (H) 65 - 99 mg/dL   Comment 1 Notify RN    Comment 2 Document in Chart   Surgical pcr screen     Status: Abnormal   Collection Time: 06/07/17  6:20 PM  Result Value Ref Range   MRSA, PCR NEGATIVE NEGATIVE   Staphylococcus aureus POSITIVE (A) NEGATIVE    Comment:        The Xpert SA Assay (FDA approved for NASAL specimens in patients over 70 years of age), is one component of a comprehensive surveillance program.  Test performance has been validated by [facility] for patients greater than or equal to 49 year old. It is not intended to diagnose infection nor to guide or monitor  treatment. RESULT CALLED TO, READ BACK BY AND VERIFIED WITH: DANIEL,C ON 06/07/17 AT 2250 BY LOY,C   Glucose, capillary     Status: Abnormal   Collection Time: 06/07/17  9:04 PM  Result Value Ref Range   Glucose-Capillary 128 (H) 65 - 99 mg/dL   Comment 1 Notify RN    Comment 2 Document in Chart   Glucose, capillary     Status: Abnormal   Collection Time: 06/08/17  6:39 AM  Result Value Ref Range   Glucose-Capillary 116 (H) 65 - 99 mg/dL  Glucose, capillary     Status: Abnormal   Collection Time: 06/08/17  9:04 AM  Result Value Ref Range   Glucose-Capillary 135 (H) 65 - 99 mg/dL  Glucose, capillary     Status: Abnormal   Collection Time: 06/08/17 11:30 AM  Result Value Ref Range   Glucose-Capillary 142 (H) 65 - 99 mg/dL   Comment 1 Notify RN   Glucose, capillary     Status: Abnormal   Collection Time: 06/08/17  4:17 PM  Result Value Ref Range   Glucose-Capillary 163 (H) 65 - 99 mg/dL  Glucose, capillary     Status: Abnormal   Collection Time: 06/08/17 10:12 PM  Result Value Ref Range   Glucose-Capillary 142 (H) 65 - 99 mg/dL   Comment 1 Notify RN    Comment 2 Document in Chart   Basic metabolic panel  Status: Abnormal   Collection Time: 06/09/17  4:40 AM  Result Value Ref Range   Sodium 135 135 - 145 mmol/Madden   Potassium 3.5 3.5 - 5.1 mmol/Madden   Chloride 103 101 - 111 mmol/Madden   CO2 25 22 - 32 mmol/Madden   Glucose, Bld 152 (H) 65 - 99 mg/dL   BUN 16 6 - 20 mg/dL   Creatinine, Ser 1.15 0.61 - 1.24 mg/dL   Calcium 8.1 (Madden) 8.9 - 10.3 mg/dL   GFR calc non Af Amer 56 (Madden) >60 mL/min   GFR calc Af Amer >60 >60 mL/min    Comment: (NOTE) The eGFR has been calculated using the CKD EPI equation. This calculation has not been validated in all clinical situations. eGFR's persistently <60 mL/min signify possible Chronic Kidney Disease.    Anion gap 7 5 - 15  CBC     Status: Abnormal   Collection Time: 06/09/17  4:40 AM  Result Value Ref Range   WBC 8.4 4.0 - 10.5 K/uL   RBC 3.55  (Madden) 4.22 - 5.81 MIL/uL   Hemoglobin 10.7 (Madden) 13.0 - 17.0 g/dL   HCT 30.9 (Madden) 39.0 - 52.0 %   MCV 87.0 78.0 - 100.0 fL   MCH 30.1 26.0 - 34.0 pg   MCHC 34.6 30.0 - 36.0 g/dL   RDW 14.2 11.5 - 15.5 %   Platelets 148 (Madden) 150 - 400 K/uL  Glucose, capillary     Status: Abnormal   Collection Time: 06/09/17  7:37 AM  Result Value Ref Range   Glucose-Capillary 126 (H) 65 - 99 mg/dL   Comment 1 Notify RN    Comment 2 Document in Chart     ABGS No results for input(s): PHART, PO2ART, TCO2, HCO3 in the last 72 hours.  Invalid input(s): PCO2 CULTURES Recent Results (from the past 240 hour(s))  Surgical pcr screen     Status: Abnormal   Collection Time: 06/07/17  6:20 PM  Result Value Ref Range Status   MRSA, PCR NEGATIVE NEGATIVE Final   Staphylococcus aureus POSITIVE (A) NEGATIVE Final    Comment:        The Xpert SA Assay (FDA approved for NASAL specimens in patients over 72 years of age), is one component of a comprehensive surveillance program.  Test performance has been validated by [facility] for patients greater than or equal to 61 year old. It is not intended to diagnose infection nor to guide or monitor treatment. RESULT CALLED TO, READ BACK BY AND VERIFIED WITH: DANIEL,C ON 06/07/17 AT 2250 BY LOY,C    Studies/Results: No results found.  Medications:  Prior to Admission:  Prescriptions Prior to Admission  Medication Sig Dispense Refill Last Dose  . acetaminophen (TYLENOL) 325 MG tablet Take 650 mg by mouth every 6 (six) hours as needed for mild pain, fever or headache.   06/04/2017 at 0930  . aspirin 325 MG tablet Take 650 mg by mouth daily.    06/03/2017 at 1300  . hydrocortisone 2.5 % cream Apply 1 application topically 2 (two) times daily.   06/03/2017 at Unknown time  . losartan-hydrochlorothiazide (HYZAAR) 100-12.5 MG tablet Take 1 tablet by mouth daily.   06/03/2017 at 1300  . metFORMIN (GLUMETZA) 500 MG (MOD) 24 hr tablet Take 500 mg by mouth daily with  breakfast.   06/03/2017 at Unknown time  . metoprolol tartrate (LOPRESSOR) 25 MG tablet Take 25 mg by mouth 2 (two) times daily.   06/03/2017 at 1300  .  PROCTOZONE-HC 2.5 % rectal cream Apply 1 application topically 2 (two) times daily.   06/04/2017 at Unknown time  . simvastatin (ZOCOR) 40 MG tablet Take 20 mg by mouth at bedtime.    06/03/2017 at Unknown time  . TRIPLE ANTIBIOTIC PLUS MAX ST 1 % OINT Apply 1 application topically 2 (two) times daily.   06/03/2017 at Unknown time   Scheduled: . alvimopan  12 mg Oral BID  . Chlorhexidine Gluconate Cloth  6 each Topical Once  . Chlorhexidine Gluconate Cloth  6 each Topical Daily  . enoxaparin (LOVENOX) injection  40 mg Subcutaneous Q24H  . feeding supplement  1 Container Oral TID BM  . feeding supplement (PRO-STAT SUGAR FREE 64)  30 mL Oral TID  . hydrocortisone  1 application Topical BID  . insulin aspart  0-9 Units Subcutaneous TID WC  . metoprolol tartrate  25 mg Oral BID  . mupirocin ointment  1 application Nasal BID  . simvastatin  20 mg Oral QHS   Continuous: . lactated ringers 50 mL/hr at 06/09/17 0036  . 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 He has metastatic disease. He has had a diverting colostomy as a palliative treatment. He had some trouble with colostomy last night but hopes to go home today. Discussed with the patient and his son at bedside about potential referral to oncology and they are not wanting to do that at least right now. I think he will need home health services. Principal Problem:   Rectal mass Active Problems:   Rectal pain   Hypertension   Hyperlipidemia   Diabetes mellitus without complication ([place])   AKI (acute kidney injury) [facility])   Palliative care encounter   Goals of care, counseling/discussion    Encounter for hospice care discussion    Plan: Potential discharge    LOS: 4 days   Andrew Madden 06/09/2017, 8:24 AM

## 2017-06-09 NOTE — Care Management Important Message (Signed)
Important Message  Patient Details  Name: Andrew Madden MRN: [id number] Date of Birth: [birth year]   Medicare Important Message Given:  Yes    Sherald Barge, RN 06/09/2017, 10:36 AM

## 2017-06-09 NOTE — Anesthesia Postprocedure Evaluation (Signed)
Anesthesia Post Note  Patient: BIRDIE BEVERIDGE  Procedure(s) Performed: Procedure(s) (LRB): TRANSVERSE LOOP COLOSTOMY FOR PALLIATION (N/A)  Patient location during evaluation: Nursing Unit Anesthesia Type: General Level of consciousness: awake and patient cooperative Pain management: pain level controlled Vital Signs Assessment: post-procedure vital signs reviewed and stable Respiratory status: spontaneous breathing, nonlabored ventilation and respiratory function stable Cardiovascular status: blood pressure returned to baseline Postop Assessment: no signs of nausea or vomiting Anesthetic complications: no     Last Vitals:  Vitals:   06/08/17 2208 06/09/17 0651  BP: (!) 147/97 (!) 151/87  Pulse: 99 86  Resp: 18 18  Temp: 37.1 C 36.9 C    Last Pain:  Vitals:   06/09/17 0651  TempSrc: Oral  PainSc:                  Karmon Andis J

## 2017-06-09 NOTE — Progress Notes (Signed)
Pt's IV catheter removed and intact. Pt's IV site clean dry and intact. Discharge instructions including medications and follow up appointments were reviewed and discussed with patient's wife. Pt's wife verbalized understanding of discharge instructions. All questions were answered and  No further questions at this time. Pt in stable condition and in no acute distress at time of discharge. Pt escorted by nurse tech.

## 2017-06-09 NOTE — Consult Note (Signed)
[place] Nurse ostomy consult note Stoma type/location: Transverse colostomy Stomal assessment/size: 1 3/4" with retention rod in place. - edematous well budded stoma Peristomal assessment: intact Treatment options for stomal/peristomal skin: USed barrier ring to promote seal and improve wear time Output soft brown stool in pouch .  Ostomy pouching: 2pc.  2 3/4 " pouch with barrier ring Education provided: Patient and son observe pouch change.  State that patient's granddaugher is in nursing school and will helping to care for ostomy.  Will still need ongoing education with HH. Pouch is removed and retention rod function is explained to patient and son.  Patient is not literate, booklet with images is provided to patient and family for reference.  Explained barrier ring function and application process.  Cut barrier to fit and explained that stoma size will change in the coming weeks and that MD will remove rod in the coming weeks. Two piece system is applied and roll closure demonstrated.  Explained to empty when 1/3 full and to change system twice weekly.   Patient and family are grateful for teaching but still need ongoing assistance.  Enrolled patient in [place] program: No- will today [facility] team will follow.  Domenic Moras RN BSN [employer] Pager [phone]

## 2017-06-09 NOTE — Care Management Note (Signed)
Case Management Note  Patient Details  Name: CATARINO VOLD MRN: [id number] Date of Birth: March 04, 1929  Expected Discharge Date:  06/09/17               Expected Discharge Plan:  [place]  In-House Referral:  NA  Discharge planning Services  CM Consult  Post Acute Care Choice:  Home Health Choice offered to:  Patient  HH Arranged:  RN [facility] Agency:  [place]  Status of Service:  Completed, signed off  Additional Comments: Pt discharging home today with [facility] nursing for colostomy care. Family at bedside. They have chosen [facility] from list of [facility] providers. Family aware HH has 48hrs to make first visit. Manuela Schwartz, [facility] rep, aware of referral and will obtain pt info from chart.   Sherald Barge, RN 06/09/2017, 10:36 AM

## 2017-06-09 NOTE — Progress Notes (Signed)
1 Day Post-Op  Subjective: Patient doing well. He is eating.  Objective: Vital signs in last 24 hours: Temp:  [98 F (36.7 C)-98.7 F (37.1 C)] 98.4 F (36.9 C) (06/22 0651) Pulse Rate:  [78-99] 86 (06/22 0651) Resp:  [15-18] 18 (06/22 0651) BP: (131-168)/(67-97) 151/87 (06/22 0651) SpO2:  [95 %-100 %] 98 % (06/22 0651) Last BM Date: 06/08/17  Intake/Output from previous day: 06/21 0701 - 06/22 0700 In: 3820 [P.O.:640; I.V.:3180] Out: 25 [Blood:25] Intake/Output this shift: No intake/output data recorded.  General appearance: alert, cooperative and no distress GI: Colostomy pink and slightly edematous. Gas noted in bag.  Lab Results:   Recent Labs  06/09/17 0440  WBC 8.4  HGB 10.7*  HCT 30.9*  PLT 148*   BMET  Recent Labs  06/09/17 0440  NA 135  K 3.5  CL 103  CO2 25  GLUCOSE 152*  BUN 16  CREATININE 1.15  CALCIUM 8.1*   PT/INR No results for input(s): LABPROT, INR in the last 72 hours.  Studies/Results: No results found.  Anti-infectives: Anti-infectives    Start     Dose/Rate Route Frequency Ordered Stop   06/07/17 0945  ertapenem (INVANZ) 1 g in sodium chloride 0.9 % 50 mL IVPB  Status:  Discontinued     1 g 100 mL/hr over 30 Minutes Intravenous On call to O.R. 06/07/17 0941 06/08/17 0559      Assessment/Plan: s/p Procedure(s): TRANSVERSE LOOP COLOSTOMY FOR PALLIATION Impression: Stable postop. Okay for discharge from surgery standpoint.  LOS: 4 days    Aviva Signs 06/09/2017

## 2017-06-09 NOTE — Addendum Note (Signed)
Addendum  created 06/09/17 0911 by Charmaine Downs, CRNA   Sign clinical note

## 2017-06-10 DIAGNOSIS — Z433 Encounter for attention to colostomy: Secondary | ICD-10-CM | POA: Diagnosis not present

## 2017-06-10 DIAGNOSIS — E440 Moderate protein-calorie malnutrition: Secondary | ICD-10-CM | POA: Diagnosis not present

## 2017-06-10 DIAGNOSIS — I252 Old myocardial infarction: Secondary | ICD-10-CM | POA: Diagnosis not present

## 2017-06-10 DIAGNOSIS — I10 Essential (primary) hypertension: Secondary | ICD-10-CM | POA: Diagnosis not present

## 2017-06-10 DIAGNOSIS — C20 Malignant neoplasm of rectum: Secondary | ICD-10-CM | POA: Diagnosis not present

## 2017-06-10 DIAGNOSIS — E119 Type 2 diabetes mellitus without complications: Secondary | ICD-10-CM | POA: Diagnosis not present

## 2017-06-10 DIAGNOSIS — Z7984 Long term (current) use of oral hypoglycemic drugs: Secondary | ICD-10-CM | POA: Diagnosis not present

## 2017-06-10 DIAGNOSIS — E785 Hyperlipidemia, unspecified: Secondary | ICD-10-CM | POA: Diagnosis not present

## 2017-06-10 DIAGNOSIS — Z48815 Encounter for surgical aftercare following surgery on the digestive system: Secondary | ICD-10-CM | POA: Diagnosis not present

## 2017-06-12 DIAGNOSIS — Z48815 Encounter for surgical aftercare following surgery on the digestive system: Secondary | ICD-10-CM | POA: Diagnosis not present

## 2017-06-12 DIAGNOSIS — E119 Type 2 diabetes mellitus without complications: Secondary | ICD-10-CM | POA: Diagnosis not present

## 2017-06-12 DIAGNOSIS — E785 Hyperlipidemia, unspecified: Secondary | ICD-10-CM | POA: Diagnosis not present

## 2017-06-12 DIAGNOSIS — Z7984 Long term (current) use of oral hypoglycemic drugs: Secondary | ICD-10-CM | POA: Diagnosis not present

## 2017-06-12 DIAGNOSIS — E440 Moderate protein-calorie malnutrition: Secondary | ICD-10-CM | POA: Diagnosis not present

## 2017-06-12 DIAGNOSIS — C20 Malignant neoplasm of rectum: Secondary | ICD-10-CM | POA: Diagnosis not present

## 2017-06-12 DIAGNOSIS — I252 Old myocardial infarction: Secondary | ICD-10-CM | POA: Diagnosis not present

## 2017-06-12 DIAGNOSIS — Z433 Encounter for attention to colostomy: Secondary | ICD-10-CM | POA: Diagnosis not present

## 2017-06-12 DIAGNOSIS — I10 Essential (primary) hypertension: Secondary | ICD-10-CM | POA: Diagnosis not present

## 2017-06-16 DIAGNOSIS — Z433 Encounter for attention to colostomy: Secondary | ICD-10-CM | POA: Diagnosis not present

## 2017-06-16 DIAGNOSIS — E119 Type 2 diabetes mellitus without complications: Secondary | ICD-10-CM | POA: Diagnosis not present

## 2017-06-16 DIAGNOSIS — C20 Malignant neoplasm of rectum: Secondary | ICD-10-CM | POA: Diagnosis not present

## 2017-06-16 DIAGNOSIS — Z48815 Encounter for surgical aftercare following surgery on the digestive system: Secondary | ICD-10-CM | POA: Diagnosis not present

## 2017-06-16 DIAGNOSIS — Z7984 Long term (current) use of oral hypoglycemic drugs: Secondary | ICD-10-CM | POA: Diagnosis not present

## 2017-06-16 DIAGNOSIS — E440 Moderate protein-calorie malnutrition: Secondary | ICD-10-CM | POA: Diagnosis not present

## 2017-06-16 DIAGNOSIS — I10 Essential (primary) hypertension: Secondary | ICD-10-CM | POA: Diagnosis not present

## 2017-06-16 DIAGNOSIS — I252 Old myocardial infarction: Secondary | ICD-10-CM | POA: Diagnosis not present

## 2017-06-16 DIAGNOSIS — E785 Hyperlipidemia, unspecified: Secondary | ICD-10-CM | POA: Diagnosis not present

## 2017-06-19 DIAGNOSIS — Z48815 Encounter for surgical aftercare following surgery on the digestive system: Secondary | ICD-10-CM | POA: Diagnosis not present

## 2017-06-19 DIAGNOSIS — C20 Malignant neoplasm of rectum: Secondary | ICD-10-CM | POA: Diagnosis not present

## 2017-06-19 DIAGNOSIS — Z433 Encounter for attention to colostomy: Secondary | ICD-10-CM | POA: Diagnosis not present

## 2017-06-19 DIAGNOSIS — E119 Type 2 diabetes mellitus without complications: Secondary | ICD-10-CM | POA: Diagnosis not present

## 2017-06-19 DIAGNOSIS — I10 Essential (primary) hypertension: Secondary | ICD-10-CM | POA: Diagnosis not present

## 2017-06-19 DIAGNOSIS — I252 Old myocardial infarction: Secondary | ICD-10-CM | POA: Diagnosis not present

## 2017-06-19 DIAGNOSIS — E785 Hyperlipidemia, unspecified: Secondary | ICD-10-CM | POA: Diagnosis not present

## 2017-06-19 DIAGNOSIS — E440 Moderate protein-calorie malnutrition: Secondary | ICD-10-CM | POA: Diagnosis not present

## 2017-06-19 DIAGNOSIS — Z7984 Long term (current) use of oral hypoglycemic drugs: Secondary | ICD-10-CM | POA: Diagnosis not present

## 2017-08-19 DEATH — deceased
